# Patient Record
Sex: Male | Born: 1965 | Race: White | Hispanic: No | Marital: Married | Smoking: Former smoker
Health system: Southern US, Community
[De-identification: ages and names within clinical notes are randomized; demographics above are authoritative.]

## PROBLEM LIST (undated history)

## (undated) DIAGNOSIS — F32A Depression, unspecified: Secondary | ICD-10-CM

## (undated) DIAGNOSIS — E7800 Pure hypercholesterolemia, unspecified: Secondary | ICD-10-CM

## (undated) DIAGNOSIS — E039 Hypothyroidism, unspecified: Secondary | ICD-10-CM

## (undated) DIAGNOSIS — I10 Essential (primary) hypertension: Secondary | ICD-10-CM

## (undated) HISTORY — DX: Essential (primary) hypertension: I10

## (undated) HISTORY — DX: Hypothyroidism, unspecified: E03.9

## (undated) HISTORY — DX: Pure hypercholesterolemia, unspecified: E78.00

## (undated) HISTORY — DX: Depression, unspecified: F32.A

---
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
## 2010-07-31 HISTORY — PX: KNEE ARTHROSCOPY: SHX127

## 2012-07-31 HISTORY — PX: SHOULDER ARTHROSCOPY: SHX128

## 2014-07-31 HISTORY — PX: HERNIA REPAIR: SHX51

## 2015-08-10 LAB — HM COLONOSCOPY

## 2015-12-22 LAB — HM COLONOSCOPY

## 2019-10-30 LAB — CBC AND DIFFERENTIAL
HCT: 51 (ref 41–53)
Hemoglobin: 17.4 (ref 13.5–17.5)
Platelets: 267 (ref 150–399)
WBC: 8.3

## 2019-10-30 LAB — BASIC METABOLIC PANEL
BUN: 14 (ref 4–21)
CO2: 22 (ref 13–22)
Chloride: 104 (ref 99–108)
Creatinine: 0.9 (ref 0.6–1.3)
Glucose: 100
Potassium: 4.4 (ref 3.4–5.3)
Sodium: 141 (ref 137–147)

## 2019-10-30 LAB — LIPID PANEL
Cholesterol: 163 (ref 0–200)
HDL: 43 (ref 35–70)
LDL Cholesterol: 71
Triglycerides: 305 — AB (ref 40–160)

## 2019-10-30 LAB — COMPREHENSIVE METABOLIC PANEL
Albumin: 4.7 (ref 3.5–5.0)
Calcium: 9.7 (ref 8.7–10.7)
GFR calc non Af Amer: 98
Globulin: 2.2

## 2019-10-30 LAB — HEPATIC FUNCTION PANEL
ALT: 33 (ref 10–40)
AST: 38 (ref 14–40)
Alkaline Phosphatase: 72 (ref 25–125)

## 2019-10-30 LAB — HEMOGLOBIN A1C: Hemoglobin A1C: 5.3

## 2019-10-30 LAB — TESTOSTERONE: Testosterone: 610

## 2020-03-19 ENCOUNTER — Ambulatory Visit: Payer: 59 | Admitting: Podiatry

## 2020-03-19 ENCOUNTER — Encounter: Payer: Self-pay | Admitting: Podiatry

## 2020-03-19 ENCOUNTER — Ambulatory Visit (INDEPENDENT_AMBULATORY_CARE_PROVIDER_SITE_OTHER): Payer: 59

## 2020-03-19 ENCOUNTER — Other Ambulatory Visit: Payer: Self-pay

## 2020-03-19 DIAGNOSIS — M779 Enthesopathy, unspecified: Secondary | ICD-10-CM

## 2020-03-19 DIAGNOSIS — D3610 Benign neoplasm of peripheral nerves and autonomic nervous system, unspecified: Secondary | ICD-10-CM

## 2020-03-19 DIAGNOSIS — G5762 Lesion of plantar nerve, left lower limb: Secondary | ICD-10-CM | POA: Diagnosis not present

## 2020-03-19 NOTE — Patient Instructions (Signed)

## 2020-03-24 NOTE — Progress Notes (Signed)
Subjective:   Patient ID: Michael Miller, male   DOB: 54 y.o.   MRN: [id number]   HPI Patient presents stating that he has had a neuroma surgery left and has had reoccurrence and states it sore when he tries to be active and he gets shooting pain.  States it has been bothering him for around a year and he had surgery approximately 2-1/2 3 years ago   Review of Systems  All other systems reviewed and are negative.       Objective:  Physical Exam Vitals and nursing note reviewed.  Constitutional:      Appearance: He is well-developed.  Pulmonary:     Effort: Pulmonary effort is normal.  Musculoskeletal:        General: Normal range of motion.  Skin:    General: Skin is warm.  Neurological:     Mental Status: He is alert.     Neurovascular status intact muscle strength adequate range of motion within normal limits.  Patient is found to have what appears to be some neuroma-like symptomatology third interspace left with possibility also of discomfort in the joint.  Patient does take medicines also for gout but does not appear to have gout attack currently.  Patient has good digit perfusion well oriented x3     Assessment:  Possibility for neuroma symptomatology versus inflammatory capsulitis or other pathology     Plan:  H&P reviewed condition and at this point I did do a sterile prep and injected with a combination of Xylocaine Marcaine and dexamethasone Kenalog.  I want to see the results to this and we may have to make a decision about resection of the nerve  X-rays indicate that there is no signs of advanced arthritis or issues associated with gout

## 2020-04-09 ENCOUNTER — Ambulatory Visit: Payer: 59 | Admitting: Podiatry

## 2020-04-16 ENCOUNTER — Ambulatory Visit: Payer: 59 | Admitting: Podiatry

## 2020-05-13 ENCOUNTER — Encounter: Payer: Self-pay | Admitting: Podiatry

## 2020-05-13 ENCOUNTER — Other Ambulatory Visit: Payer: Self-pay

## 2020-05-13 ENCOUNTER — Ambulatory Visit: Payer: 59 | Admitting: Podiatry

## 2020-05-13 DIAGNOSIS — M7752 Other enthesopathy of left foot: Secondary | ICD-10-CM

## 2020-05-13 DIAGNOSIS — M779 Enthesopathy, unspecified: Secondary | ICD-10-CM

## 2020-05-13 DIAGNOSIS — M7751 Other enthesopathy of right foot: Secondary | ICD-10-CM

## 2020-05-13 DIAGNOSIS — D3610 Benign neoplasm of peripheral nerves and autonomic nervous system, unspecified: Secondary | ICD-10-CM

## 2020-05-14 NOTE — Progress Notes (Signed)
Subjective:   Patient ID: Michael Miller, male   DOB: 54 y.o.   MRN: [id number]   HPI Patient states he needs new orthotics and he stated he seemed to have a very short-term response to the medication we use last time and he is interested in what to do long-term.  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  I have recommended trying neurolysis treatment with consideration of resection if symptoms do not respond.  I did do a sterile prep I injected the nerve with a purified alcohol Marcaine solution and applied sterile dressing.  I then went ahead and casted him for orthotics to reduce forefoot stresses and pain and hopefully prevent surgery

## 2020-06-10 ENCOUNTER — Other Ambulatory Visit: Payer: 59 | Admitting: Orthotics

## 2020-06-10 ENCOUNTER — Ambulatory Visit: Payer: 59 | Admitting: Podiatry

## 2020-06-17 ENCOUNTER — Other Ambulatory Visit: Payer: Self-pay

## 2020-06-17 ENCOUNTER — Other Ambulatory Visit: Payer: 59 | Admitting: Orthotics

## 2020-06-17 ENCOUNTER — Ambulatory Visit: Payer: 59 | Admitting: Podiatry

## 2020-06-17 ENCOUNTER — Encounter: Payer: Self-pay | Admitting: Podiatry

## 2020-06-17 DIAGNOSIS — D3610 Benign neoplasm of peripheral nerves and autonomic nervous system, unspecified: Secondary | ICD-10-CM | POA: Diagnosis not present

## 2020-06-17 NOTE — Progress Notes (Signed)
Subjective:   Patient ID: Michael Miller, male   DOB: 54 y.o.   MRN: [id number]   HPI Patient presents stating that the area did not respond to the injection and he is most likely can have to have this fixed.  Patient points to the third interspace left foot he did have previous foot surgery about 3 and half to 4 years ago and its been giving him trouble for the last couple years   ROS      Objective:  Physical Exam  Neurovascular status intact with patient's third interspace left showing a positive Mulder sign pain to pressure and burning-like discomfort.  He had temporary response to medication of approximate 24 hours with reoccurrence of symptoms and it is painful with shoe gear     Assessment:  Appears to be a neuroma symptomatology third interspace left even though he had previous surgery done with possible residual regrowth or possibly that they were not successful in removing the offending tissue     Plan:  H&P reviewed condition at great length and discussed options.  He is going to start orthotics I do not recommend further injections as they have not been successful and I do think resection is necessary for the future.  Patient wants to get this done not sure on timing.  I did go ahead today and I allowed him to read consent form understanding alternative treatments complications and after extensive review signed.  Understands that this is revisional surgery absolutely no long-term guarantee this will solve the problem and is willing to accept risk and does want the procedure.  He will tentatively have surgery done in January he is encouraged to call with questions concerns and was given all preoperative instructions today

## 2020-08-05 ENCOUNTER — Telehealth: Payer: Self-pay

## 2020-08-05 NOTE — Telephone Encounter (Signed)
DOS 08/17/2020  NEURECTOMY 3RD LT - 28080  [employer] EFFECTIVE DATE - 07/31/2013  PLAN DEDUCTIBLE - $250.00 W/ $250.00 REMAINING OUT OF POCKET - $3300.00 W/ $3300.00 REMAINING COPAY $150.00 COINSURANCE - 85%  PER AUTOMATED SYSTEM, NO PRECERT REQUIRED FOR CPT 28080. CALL REF # [id number]

## 2020-08-17 ENCOUNTER — Encounter: Payer: Self-pay | Admitting: Podiatry

## 2020-08-17 ENCOUNTER — Other Ambulatory Visit: Payer: Self-pay | Admitting: Podiatry

## 2020-08-17 DIAGNOSIS — G5762 Lesion of plantar nerve, left lower limb: Secondary | ICD-10-CM | POA: Diagnosis not present

## 2020-08-20 ENCOUNTER — Encounter: Payer: Self-pay | Admitting: Podiatry

## 2020-08-23 ENCOUNTER — Encounter: Payer: Self-pay | Admitting: Podiatry

## 2020-08-23 ENCOUNTER — Other Ambulatory Visit: Payer: Self-pay

## 2020-08-23 ENCOUNTER — Ambulatory Visit (INDEPENDENT_AMBULATORY_CARE_PROVIDER_SITE_OTHER): Payer: 59 | Admitting: Podiatry

## 2020-08-23 ENCOUNTER — Encounter: Payer: 59 | Admitting: Podiatry

## 2020-08-23 DIAGNOSIS — D3610 Benign neoplasm of peripheral nerves and autonomic nervous system, unspecified: Secondary | ICD-10-CM | POA: Diagnosis not present

## 2020-08-24 NOTE — Progress Notes (Signed)
Subjective:   Patient ID: Michael Miller, male   DOB: 55 y.o.   MRN: [id number]   HPI Patient presents stating he is doing well after surgery and does not have the same pain he had had previous   ROS      Objective:  Physical Exam  Neurovascular status intact negative Bevelyn Buckles' sign noted wound edges well coapted third interspace left foot no drainage noted with numbness between the third and fourth toe     Assessment:  Doing well post neurectomy third interspace left     Plan:  Advised this patient on continued elevation compression and reapplied sterile dressing. Instructed on gradual increase in activities over the next few weeks and patient will be seen back to recheck

## 2020-08-25 ENCOUNTER — Encounter: Payer: Self-pay | Admitting: Podiatry

## 2020-08-26 ENCOUNTER — Other Ambulatory Visit: Payer: Self-pay

## 2020-08-26 ENCOUNTER — Encounter: Payer: Self-pay | Admitting: Internal Medicine

## 2020-08-26 ENCOUNTER — Ambulatory Visit: Payer: 59 | Admitting: Internal Medicine

## 2020-08-26 VITALS — BP 124/84 | HR 93 | Temp 98.9°F | Ht 70.0 in | Wt 196.0 lb

## 2020-08-26 DIAGNOSIS — M8949 Other hypertrophic osteoarthropathy, multiple sites: Secondary | ICD-10-CM | POA: Insufficient documentation

## 2020-08-26 DIAGNOSIS — E039 Hypothyroidism, unspecified: Secondary | ICD-10-CM

## 2020-08-26 DIAGNOSIS — M1A09X0 Idiopathic chronic gout, multiple sites, without tophus (tophi): Secondary | ICD-10-CM

## 2020-08-26 DIAGNOSIS — I10 Essential (primary) hypertension: Secondary | ICD-10-CM

## 2020-08-26 DIAGNOSIS — M159 Polyosteoarthritis, unspecified: Secondary | ICD-10-CM | POA: Insufficient documentation

## 2020-08-26 DIAGNOSIS — E785 Hyperlipidemia, unspecified: Secondary | ICD-10-CM

## 2020-08-26 MED ORDER — MELOXICAM 15 MG PO TABS: 15.0000 mg | ORAL_TABLET | Freq: Every day | ORAL | 1 refills | Status: DC

## 2020-08-26 NOTE — Progress Notes (Signed)
Subjective:  Patient ID: Michael Miller, male    DOB: 1966-04-16  Age: 55 y.o. MRN: [id number]  CC: Osteoarthritis and Hypertension  This visit occurred during the SARS-CoV-2 public health emergency.  Safety protocols were in place, including screening questions prior to the visit, additional usage of staff PPE, and extensive cleaning of exam room while observing appropriate contact time as indicated for disinfecting solutions.   NEW TO ME  HPI Michael Miller presents for establishing.  He recently moved from [place] to [facility] and tells me that he has had a complete physical within the last year.  He complains of pain in his small and large joints.  None of his joints never get red or swollen.  He describes it as a burning discomfort.  He tells me his blood pressure has been well controlled.  He is active and denies any recent episodes of chest pain, shortness of breath, palpitations, edema, or fatigue.  He tells me he will have his medical records sent to me.  History Michael Miller has a past medical history of Depression, Hypercholesteremia, Hypertension, and Hypothyroid.   He has a past surgical history that includes Tonsillectomy (1971).   His family history includes CAD in his brother and father; Hypertension in his brother.He reports that he has quit smoking. He has never used smokeless tobacco. He reports current alcohol use of about 7.0 standard drinks of alcohol per week. He reports that he does not use drugs.  Outpatient Medications Prior to Visit  Medication Sig Dispense Refill  . allopurinol (ZYLOPRIM) 300 MG tablet Take 300 mg by mouth daily.    Marland Kitchen amLODipine (NORVASC) 5 MG tablet Take 5 mg by mouth daily.    Marland Kitchen atorvastatin (LIPITOR) 20 MG tablet Take 20 mg by mouth daily.    . busPIRone (BUSPAR) 15 MG tablet Take 15 mg by mouth 2 (two) times daily.    . clonazePAM (KLONOPIN) 0.5 MG tablet Take 0.5 mg by mouth daily as needed.    Marland Kitchen erythromycin ophthalmic ointment SMARTSIG:In Eye(s)    .  sertraline (ZOLOFT) 50 MG tablet Take by mouth.    . XYOSTED 75 MG/0.5ML SOAJ Inject into the skin.     No facility-administered medications prior to visit.    ROS Review of Systems  Constitutional: Negative.  Negative for appetite change, diaphoresis, fatigue and unexpected weight change.  HENT: Negative.   Eyes: Negative for visual disturbance.  Respiratory: Negative for cough, chest tightness, shortness of breath and wheezing.   Cardiovascular: Negative for chest pain, palpitations and leg swelling.  Gastrointestinal: Negative for abdominal pain, blood in stool, constipation, diarrhea, nausea and vomiting.  Endocrine: Negative.   Genitourinary: Negative.  Negative for difficulty urinating, dysuria and hematuria.  Musculoskeletal: Positive for arthralgias. Negative for myalgias.  Skin: Negative.   Neurological: Negative.  Negative for dizziness, weakness and light-headedness.  Hematological: Negative for adenopathy. Does not bruise/bleed easily.  Psychiatric/Behavioral: Negative.      No results found for: WBC, HGB, HCT, PLT, GLUCOSE, CHOL, TRIG, HDL, LDLDIRECT, LDLCALC, ALT, AST, NA, K, CL, CREATININE, BUN, CO2, TSH, PSA, INR, GLUF, HGBA1C, MICROALBUR  Objective:  BP 124/84   Pulse 93   Temp 98.9 F (37.2 C) (Oral)   Ht 5\' 10"  (1.778 m)   Wt 196 lb (88.9 kg)   SpO2 95%   BMI 28.12 kg/m   Physical Exam Vitals reviewed.  HENT:     Nose: Nose normal.     Mouth/Throat:     Mouth: Mucous membranes  are moist.  Eyes:     General: No scleral icterus.    Conjunctiva/sclera: Conjunctivae normal.  Cardiovascular:     Rate and Rhythm: Normal rate and regular rhythm.     Heart sounds: No murmur heard.   Pulmonary:     Effort: Pulmonary effort is normal.     Breath sounds: No stridor. No wheezing, rhonchi or rales.  Abdominal:     General: Abdomen is flat.     Palpations: There is no mass.     Tenderness: There is no abdominal tenderness. There is no guarding.   Musculoskeletal:        General: Normal range of motion.     Cervical back: Neck supple.     Right lower leg: No edema.     Left lower leg: No edema.  Lymphadenopathy:     Cervical: No cervical adenopathy.  Skin:    General: Skin is warm and dry.     Coloration: Skin is not pale.  Neurological:     General: No focal deficit present.     Mental Status: He is alert.  Psychiatric:        Mood and Affect: Mood normal.       Assessment & Plan:   Michael Miller was seen today for osteoarthritis and hypertension.  Diagnoses and all orders for this visit:  Primary osteoarthritis involving multiple joints -     meloxicam (MOBIC) 15 MG tablet; Take 1 tablet (15 mg total) by mouth daily.  Idiopathic chronic gout of multiple sites without tophus  Acquired hypothyroidism  Hyperlipidemia with target LDL less than 130  Primary hypertension- His blood pressure is adequately well controlled.   I am having Michael Miller start on meloxicam. I am also having him maintain his allopurinol, amLODipine, atorvastatin, busPIRone, clonazePAM, erythromycin, sertraline, and Xyosted.  Meds ordered this encounter  Medications  . meloxicam (MOBIC) 15 MG tablet    Sig: Take 1 tablet (15 mg total) by mouth daily.    Dispense:  90 tablet    Refill:  1     Follow-up: No follow-ups on file.  Michael Calico, MD

## 2020-08-30 ENCOUNTER — Encounter: Payer: 59 | Admitting: Podiatry

## 2020-09-02 ENCOUNTER — Encounter: Payer: Self-pay | Admitting: Internal Medicine

## 2020-09-02 DIAGNOSIS — I10 Essential (primary) hypertension: Secondary | ICD-10-CM | POA: Insufficient documentation

## 2020-09-02 DIAGNOSIS — E039 Hypothyroidism, unspecified: Secondary | ICD-10-CM | POA: Insufficient documentation

## 2020-09-02 DIAGNOSIS — M1A09X0 Idiopathic chronic gout, multiple sites, without tophus (tophi): Secondary | ICD-10-CM | POA: Insufficient documentation

## 2020-09-02 DIAGNOSIS — E785 Hyperlipidemia, unspecified: Secondary | ICD-10-CM | POA: Insufficient documentation

## 2020-09-02 NOTE — Patient Instructions (Signed)
Osteoarthritis  Osteoarthritis is a type of arthritis. It refers to joint pain or joint disease. Osteoarthritis affects tissue that covers the ends of bones in joints (cartilage). Cartilage acts as a cushion between the bones and helps them move smoothly. Osteoarthritis occurs when cartilage in the joints gets worn down. Osteoarthritis is sometimes called "wear and tear" arthritis. Osteoarthritis is the most common form of arthritis. It often occurs in older people. It is a condition that gets worse over time. The joints most often affected by this condition are in the fingers, toes, hips, knees, and spine, including the neck and lower back. What are the causes? This condition is caused by the wearing down of cartilage that covers the ends of bones. What increases the risk? The following factors may make you more likely to develop this condition:  Being age 50 or older.  Obesity.  Overuse of joints.  Past injury of a joint.  Past surgery on a joint.  Family history of osteoarthritis. What are the signs or symptoms? The main symptoms of this condition are pain, swelling, and stiffness in the joint. Other symptoms may include:  An enlarged joint.  More pain and further damage caused by small pieces of bone or cartilage that break off and float inside of the joint.  Small deposits of bone (osteophytes) that grow on the edges of the joint.  A grating or scraping feeling inside the joint when you move it.  Popping or creaking sounds when you move.  Difficulty walking or exercising.  An inability to grip items, twist your hand(s), or control the movements of your hands and fingers. How is this diagnosed? This condition may be diagnosed based on:  Your medical history.  A physical exam.  Your symptoms.  X-rays of the affected joint(s).  Blood tests to rule out other types of arthritis. How is this treated? There is no cure for this condition, but treatment can help control  pain and improve joint function. Treatment may include a combination of therapies, such as:  Pain relief techniques, such as: ? Applying heat and cold to the joint. ? Massage. ? A form of talk therapy called cognitive behavioral therapy (CBT). This therapy helps you set goals and follow up on the changes that you make.  Medicines for pain and inflammation. The medicines can be taken by mouth or applied to the skin. They include: ? NSAIDs, such as ibuprofen. ? Prescription medicines. ? Strong anti-inflammatory medicines (corticosteroids). ? Certain nutritional supplements.  A prescribed exercise program. You may work with a physical therapist.  Assistive devices, such as a brace, wrap, splint, specialized glove, or cane.  A weight control plan.  Surgery, such as: ? An osteotomy. This is done to reposition the bones and relieve pain or to remove loose pieces of bone and cartilage. ? Joint replacement surgery. You may need this surgery if you have advanced osteoarthritis. Follow these instructions at home: Activity  Rest your affected joints as told by your health care provider.  Exercise as told by your health care provider. He or she may recommend specific types of exercise, such as: ? Strengthening exercises. These are done to strengthen the muscles that support joints affected by arthritis. ? Aerobic activities. These are exercises, such as brisk walking or water aerobics, that increase your heart rate. ? Range-of-motion activities. These help your joints move more easily. ? Balance and agility exercises. Managing pain, stiffness, and swelling  If directed, apply heat to the affected area as often   as told by your health care provider. Use the heat source that your health care provider recommends, such as a moist heat pack or a heating pad. ? If you have a removable assistive device, remove it as told by your health care provider. ? Place a towel between your skin and the heat  source. If your health care provider tells you to keep the assistive device on while you apply heat, place a towel between the assistive device and the heat source. ? Leave the heat on for 20-30 minutes. ? Remove the heat if your skin turns bright red. This is especially important if you are unable to feel pain, heat, or cold. You may have a greater risk of getting burned.  If directed, put ice on the affected area. To do this: ? If you have a removable assistive device, remove it as told by your health care provider. ? Put ice in a plastic bag. ? Place a towel between your skin and the bag. If your health care provider tells you to keep the assistive device on during icing, place a towel between the assistive device and the bag. ? Leave the ice on for 20 minutes, 2-3 times a day. ? Move your fingers or toes often to reduce stiffness and swelling. ? Raise (elevate) the injured area above the level of your heart while you are sitting or lying down.      General instructions  Take over-the-counter and prescription medicines only as told by your health care provider.  Maintain a healthy weight. Follow instructions from your health care provider for weight control.  Do not use any products that contain nicotine or tobacco, such as cigarettes, e-cigarettes, and chewing tobacco. If you need help quitting, ask your health care provider.  Use assistive devices as told by your health care provider.  Keep all follow-up visits as told by your health care provider. This is important. Where to find more information  National Institute of Arthritis and Musculoskeletal and Skin Diseases: www.niams.nih.gov  National Institute on Aging: www.nia.nih.gov  American College of Rheumatology: www.rheumatology.org Contact a health care provider if:  You have redness, swelling, or a feeling of warmth in a joint that gets worse.  You have a fever along with joint or muscle aches.  You develop a  rash.  You have trouble doing your normal activities. Get help right away if:  You have pain that gets worse and is not relieved by pain medicine. Summary  Osteoarthritis is a type of arthritis that affects tissue covering the ends of bones in joints (cartilage).  This condition is caused by the wearing down of cartilage that covers the ends of bones.  The main symptom of this condition is pain, swelling, and stiffness in the joint.  There is no cure for this condition, but treatment can help control pain and improve joint function. This information is not intended to replace advice given to you by your health care provider. Make sure you discuss any questions you have with your health care provider. Document Revised: 07/14/2019 Document Reviewed: 07/14/2019 Elsevier Patient Education  2021 [employer]  

## 2020-09-07 ENCOUNTER — Encounter: Payer: Self-pay | Admitting: Internal Medicine

## 2020-09-13 ENCOUNTER — Encounter: Payer: Self-pay | Admitting: Internal Medicine

## 2020-09-13 ENCOUNTER — Ambulatory Visit: Payer: 59 | Admitting: Internal Medicine

## 2020-09-13 ENCOUNTER — Other Ambulatory Visit: Payer: Self-pay

## 2020-09-13 DIAGNOSIS — R42 Dizziness and giddiness: Secondary | ICD-10-CM | POA: Insufficient documentation

## 2020-09-13 NOTE — Patient Instructions (Signed)
We have cleaned out the right ear today which should help.   Benign Positional Vertigo Vertigo is the feeling that you or your surroundings are moving when they are not. Benign positional vertigo is the most common form of vertigo. This is usually a harmless condition (benign). This condition is positional. This means that symptoms are triggered by certain movements and positions. This condition can be dangerous if it occurs while you are doing something that could cause harm to you or others. This includes activities such as driving or operating machinery. What are the causes? The inner ear has fluid-filled canals that help your brain sense movement and balance. When the fluid moves, the brain receives messages about your body's position. With benign positional vertigo, crystals in the inner ear break free and disturb the inner ear area. This causes your brain to receive confusing messages about your body's position. What increases the risk? You are more likely to develop this condition if:  You are a woman.  You are 21 years of age or older.  You have recently had a head injury.  You have an inner ear disease. What are the signs or symptoms? Symptoms of this condition usually happen when you move your head or your eyes in different directions. Symptoms may start suddenly, and usually last for less than a minute. They include:  Loss of balance and falling.  Feeling like you are spinning or moving.  Feeling like your surroundings are spinning or moving.  Nausea and vomiting.  Blurred vision.  Dizziness.  Involuntary eye movement (nystagmus). Symptoms can be mild and cause only minor problems, or they can be severe and interfere with daily life. Episodes of benign positional vertigo may return (recur) over time. Symptoms may improve over time. How is this diagnosed? This condition may be diagnosed based on:  Your medical history.  Physical exam of the head, neck, and  ears.  Positional tests to check for or stimulate vertigo. You may be asked to turn your head and change positions, such as going from sitting to lying down. A health care provider will watch for symptoms of vertigo. You may be referred to a health care provider who specializes in ear, nose, and throat problems (ENT, or otolaryngologist) or a provider who specializes in disorders of the nervous system (neurologist). How is this treated? This condition may be treated in a session in which your health care provider moves your head in specific positions to help the displaced crystals in your inner ear move. Treatment for this condition may take several sessions. Surgery may be needed in severe cases, but this is rare. In some cases, benign positional vertigo may resolve on its own in 2-4 weeks.   Follow these instructions at home: Safety  Move slowly. Avoid sudden body or head movements or certain positions, as told by your health care provider.  Avoid driving until your health care provider says it is safe for you to do so.  Avoid operating heavy machinery until your health care provider says it is safe for you to do so.  Avoid doing any tasks that would be dangerous to you or others if vertigo occurs.  If you have trouble walking or keeping your balance, try using a cane for stability. If you feel dizzy or unstable, sit down right away.  Return to your normal activities as told by your health care provider. Ask your health care provider what activities are safe for you. General instructions  Take over-the-counter and prescription  medicines only as told by your health care provider.  Drink enough fluid to keep your urine pale yellow.  Keep all follow-up visits as told by your health care provider. This is important. Contact a health care provider if:  You have a fever.  Your condition gets worse or you develop new symptoms.  Your family or friends notice any behavioral changes.  You  have nausea or vomiting that gets worse.  You have numbness or a prickling and tingling sensation. Get help right away if you:  Have difficulty speaking or moving.  Are always dizzy.  Faint.  Develop severe headaches.  Have weakness in your legs or arms.  Have changes in your hearing or vision.  Develop a stiff neck.  Develop sensitivity to light. Summary  Vertigo is the feeling that you or your surroundings are moving when they are not. Benign positional vertigo is the most common form of vertigo.  This condition is caused by crystals in the inner ear that become displaced. This causes a disturbance in an area of the inner ear that helps your brain sense movement and balance.  Symptoms include loss of balance and falling, feeling that you or your surroundings are moving, nausea and vomiting, and blurred vision.  This condition can be diagnosed based on symptoms, a physical exam, and positional tests.  Follow safety instructions as told by your health care provider. You will also be told when to contact your health care provider in case of problems. This information is not intended to replace advice given to you by your health care provider. Make sure you discuss any questions you have with your health care provider. Document Revised: 06/10/2019 Document Reviewed: 12/26/2017 Elsevier Patient Education  2021 [employer].

## 2020-09-13 NOTE — Progress Notes (Signed)
   Subjective:   Patient ID: Michael Miller, male    DOB: May 13, 1966, 55 y.o.   MRN: [id number]  HPI The patient is a 55 YO man coming in for episode of dizziness. Started Friday with waking up and turning head. Got some dizziness and lightheadedness. Faded and he was able to stand up. Mild nausea during this episode, no vomiting. The dizziness persisted throughout the day exacerbated by head turn and bending down. Still was present but not a severe Saturday and by Sunday almost normal. Denies preceeding illness. Most recent medication from about 2-3 weeks ago meloxicam which he has taken before without problems. Denies ear pain or hearing changes.   Review of Systems  Constitutional: Negative.   HENT: Negative.   Eyes: Negative.   Respiratory: Negative for cough, chest tightness and shortness of breath.   Cardiovascular: Negative for chest pain, palpitations and leg swelling.  Gastrointestinal: Negative for abdominal distention, abdominal pain, constipation, diarrhea, nausea and vomiting.  Musculoskeletal: Negative.   Skin: Negative.   Neurological: Positive for dizziness and light-headedness.  Psychiatric/Behavioral: Negative.     Objective:  Physical Exam Constitutional:      Appearance: He is well-developed and well-nourished.  HENT:     Head: Normocephalic and atraumatic.     Comments:  right ear canal impacted with copious hard wax, examination post ear lavage canal is clear and no bleeding or complications noted.    Right Ear: Tympanic membrane normal. There is impacted cerumen.     Left Ear: Tympanic membrane normal.  Eyes:     Extraocular Movements: EOM normal.  Cardiovascular:     Rate and Rhythm: Normal rate and regular rhythm.  Pulmonary:     Effort: Pulmonary effort is normal. No respiratory distress.     Breath sounds: Normal breath sounds. No wheezing or rales.  Abdominal:     General: Bowel sounds are normal. There is no distension.     Palpations: Abdomen is soft.      Tenderness: There is no abdominal tenderness. There is no rebound.  Musculoskeletal:        General: No edema.     Cervical back: Normal range of motion.  Skin:    General: Skin is warm and dry.  Neurological:     Mental Status: He is alert and oriented to person, place, and time.     Coordination: Coordination normal.  Psychiatric:        Mood and Affect: Mood and affect normal.     Vitals:   09/13/20 0845  BP: 128/82  Pulse: 66  Resp: 18  Temp: (!) 97.5 F (36.4 C)  TempSrc: Oral  SpO2: 96%  Weight: 197 lb 9.6 oz (89.6 kg)  Height: 5\' 10"  (1.778 m)    This visit occurred during the SARS-CoV-2 public health emergency.  Safety protocols were in place, including screening questions prior to the visit, additional usage of staff PPE, and extensive cleaning of exam room while observing appropriate contact time as indicated for disinfecting solutions.   Assessment & Plan:

## 2020-09-13 NOTE — Assessment & Plan Note (Signed)
Could have been related to wax obstruction which is removed today during visit. No signs of infection. Talked to him about epley maneuver if recurrent to help and etiology of BPPV.

## 2020-09-15 ENCOUNTER — Encounter: Payer: Self-pay | Admitting: Internal Medicine

## 2020-09-20 ENCOUNTER — Other Ambulatory Visit: Payer: Self-pay | Admitting: Internal Medicine

## 2020-09-20 DIAGNOSIS — K635 Polyp of colon: Secondary | ICD-10-CM

## 2020-10-18 ENCOUNTER — Other Ambulatory Visit: Payer: Self-pay | Admitting: Internal Medicine

## 2020-10-25 ENCOUNTER — Encounter: Payer: Self-pay | Admitting: Internal Medicine

## 2020-10-26 ENCOUNTER — Encounter: Payer: Self-pay | Admitting: Internal Medicine

## 2020-10-26 ENCOUNTER — Telehealth (INDEPENDENT_AMBULATORY_CARE_PROVIDER_SITE_OTHER): Payer: 59 | Admitting: Internal Medicine

## 2020-10-26 DIAGNOSIS — J3089 Other allergic rhinitis: Secondary | ICD-10-CM

## 2020-10-26 DIAGNOSIS — J309 Allergic rhinitis, unspecified: Secondary | ICD-10-CM | POA: Insufficient documentation

## 2020-10-26 MED ORDER — MONTELUKAST SODIUM 10 MG PO TABS: 10.0000 mg | ORAL_TABLET | Freq: Every day | ORAL | 0 refills | Status: DC

## 2020-10-26 NOTE — Progress Notes (Signed)
Virtual Visit via Video Note  I connected with Michael Miller on 10/26/20 at  3:00 PM EDT by a video enabled telemedicine application and verified that I am speaking with the correct person using two identifiers.  The patient and the provider were at separate locations throughout the entire encounter. Patient location: home, Provider location: work   I discussed the limitations of evaluation and management by telemedicine and the availability of in person appointments. The patient expressed understanding and agreed to proceed. The patient and the provider were the only parties present for the visit unless noted in HPI below.  History of Present Illness: The patient is a 55 y.o. man with visit for cough and congestion. Started 4-5 days ago. Has some fatigue and eye burning and itching. Denies fevers or chills. Overall it is stable but not improving. Has tried allegra which has not helped. Taking dayquil and nyquil which did not help much either. Has been taking flonase as well. Has had 3 shots of covid-19 immunization. Did take home covid-19 test which was negative.   Observations/Objective: Appearance: normal, breathing appears normal no coughing during visit, casual grooming, abdomen does not appear distended, throat not well visualized, mental status is A and O times 3  Assessment and Plan: See problem oriented charting  Follow Up Instructions: rx singulair  I discussed the assessment and treatment plan with the patient. The patient was provided an opportunity to ask questions and all were answered. The patient agreed with the plan and demonstrated an understanding of the instructions.   The patient was advised to call back or seek an in-person evaluation if the symptoms worsen or if the condition fails to improve as anticipated.  Hoyt Koch, MD

## 2020-10-26 NOTE — Assessment & Plan Note (Signed)
Rx singulair. Continue allegra and flonase scheduled and dayquil/nyquil as needed.

## 2020-11-17 ENCOUNTER — Other Ambulatory Visit: Payer: Self-pay | Admitting: Internal Medicine

## 2020-11-25 ENCOUNTER — Other Ambulatory Visit: Payer: Self-pay | Admitting: Internal Medicine

## 2020-11-26 ENCOUNTER — Other Ambulatory Visit: Payer: Self-pay | Admitting: Internal Medicine

## 2020-12-08 ENCOUNTER — Encounter: Payer: Self-pay | Admitting: Internal Medicine

## 2020-12-09 ENCOUNTER — Other Ambulatory Visit: Payer: Self-pay | Admitting: Internal Medicine

## 2020-12-09 DIAGNOSIS — E291 Testicular hypofunction: Secondary | ICD-10-CM

## 2020-12-09 MED ORDER — XYOSTED 75 MG/0.5ML ~~LOC~~ SOAJ
75.0000 mg | SUBCUTANEOUS | 0 refills | Status: DC
Start: 2020-12-09 — End: 2021-03-29

## 2020-12-14 ENCOUNTER — Other Ambulatory Visit: Payer: Self-pay | Admitting: Internal Medicine

## 2020-12-22 ENCOUNTER — Ambulatory Visit: Payer: 59 | Admitting: Internal Medicine

## 2020-12-22 ENCOUNTER — Other Ambulatory Visit: Payer: Self-pay

## 2020-12-22 ENCOUNTER — Encounter: Payer: Self-pay | Admitting: Internal Medicine

## 2020-12-22 VITALS — BP 138/88 | HR 76 | Temp 98.2°F | Resp 16 | Ht 70.0 in | Wt 189.0 lb

## 2020-12-22 DIAGNOSIS — I4510 Unspecified right bundle-branch block: Secondary | ICD-10-CM | POA: Insufficient documentation

## 2020-12-22 DIAGNOSIS — I10 Essential (primary) hypertension: Secondary | ICD-10-CM | POA: Diagnosis not present

## 2020-12-22 DIAGNOSIS — H6122 Impacted cerumen, left ear: Secondary | ICD-10-CM | POA: Insufficient documentation

## 2020-12-22 DIAGNOSIS — Z0001 Encounter for general adult medical examination with abnormal findings: Secondary | ICD-10-CM | POA: Insufficient documentation

## 2020-12-22 NOTE — Patient Instructions (Signed)

## 2020-12-22 NOTE — Progress Notes (Signed)
Patient consent obtained. Irrigation with water and peroxide performed. Full view of tympanic membranes after procedure.  Patient tolerated procedure well.   

## 2020-12-22 NOTE — Progress Notes (Signed)
Subjective:  Patient ID: Michael Miller, male    DOB: [birth year]  Age: 55 y.o. MRN: [id number]  CC: Hypertension  This visit occurred during the SARS-CoV-2 public health emergency.  Safety protocols were in place, including screening questions prior to the visit, additional usage of staff PPE, and extensive cleaning of exam room while observing appropriate contact time as indicated for disinfecting solutions.    HPI Eliav Mechling presents for f/up -   He was diagnosed with right bundle branch block about 8 years ago.  He tells me he saw a cardiologist in [place] and told that his cardiac work-up was unremarkable.  His blood pressure has been well controlled.  He is active and denies any recent episodes of palpitations, dizziness, lightheadedness, chest pain, shortness of breath.  He complains of a several week history of muffled sensation and loss of hearing in his left ear.  Outpatient Medications Prior to Visit  Medication Sig Dispense Refill  . allopurinol (ZYLOPRIM) 300 MG tablet Take 300 mg by mouth daily.    Marland Kitchen amLODipine (NORVASC) 5 MG tablet Take 5 mg by mouth daily.    Marland Kitchen atorvastatin (LIPITOR) 20 MG tablet Take 20 mg by mouth daily.    . busPIRone (BUSPAR) 15 MG tablet Take 15 mg by mouth daily.    . clonazePAM (KLONOPIN) 0.5 MG tablet TAKE 1 TABLET BY MOUTH EVERY DAY AS NEEDED FOR ANXIETY 30 tablet 2  . meloxicam (MOBIC) 15 MG tablet Take 1 tablet (15 mg total) by mouth daily. 90 tablet 1  . montelukast (SINGULAIR) 10 MG tablet TAKE 1 TABLET BY MOUTH EVERYDAY AT BEDTIME 90 tablet 0  . sertraline (ZOLOFT) 50 MG tablet Take 50 mg by mouth daily.    . XYOSTED 75 MG/0.5ML SOAJ Inject 75 mg into the skin once a week. 6 mL 0   No facility-administered medications prior to visit.    ROS Review of Systems  Constitutional: Negative for activity change, diaphoresis and fatigue.  HENT: Positive for hearing loss. Negative for ear discharge, ear pain, facial swelling, rhinorrhea, sinus  pressure and trouble swallowing.   Eyes: Negative.   Respiratory: Negative for cough, chest tightness, shortness of breath and wheezing.   Cardiovascular: Negative for chest pain, palpitations and leg swelling.  Gastrointestinal: Negative for abdominal pain, diarrhea, nausea and vomiting.  Endocrine: Negative.   Genitourinary: Negative.  Negative for difficulty urinating, flank pain and hematuria.  Musculoskeletal: Negative.   Skin: Negative.  Negative for color change.  Neurological: Negative.  Negative for dizziness, weakness, light-headedness and headaches.  Hematological: Negative for adenopathy. Does not bruise/bleed easily.  Psychiatric/Behavioral: Negative.     Objective:  BP 138/88 (BP Location: Left Arm, Patient Position: Sitting, Cuff Size: Large)   Pulse 76   Temp 98.2 F (36.8 C) (Oral)   Resp 16   Ht 5\' 10"  (1.778 m)   Wt 189 lb (85.7 kg)   SpO2 97%   BMI 27.12 kg/m   BP Readings from Last 3 Encounters:  12/22/20 138/88  09/13/20 128/82  08/26/20 124/84    Wt Readings from Last 3 Encounters:  12/22/20 189 lb (85.7 kg)  09/13/20 197 lb 9.6 oz (89.6 kg)  08/26/20 196 lb (88.9 kg)    Physical Exam Vitals reviewed.  Constitutional:      Appearance: Normal appearance.  HENT:     Right Ear: Hearing, tympanic membrane, ear canal and external ear normal.     Left Ear: There is impacted cerumen. No mastoid tenderness.  Tympanic membrane is not injected or erythematous.     Nose: Nose normal.     Mouth/Throat:     Mouth: Mucous membranes are moist.  Eyes:     Conjunctiva/sclera: Conjunctivae normal.  Cardiovascular:     Rate and Rhythm: Normal rate and regular rhythm.     Heart sounds: Normal heart sounds, S1 normal and S2 normal. No murmur heard. No gallop.      Comments: EKG- Sinus rhythm, 69 bpm RBBB, LAFB Minimal LVH No old EKG's for comparison Pulmonary:     Effort: Pulmonary effort is normal.     Breath sounds: No stridor. No wheezing, rhonchi or  rales.  Abdominal:     General: Abdomen is flat.     Palpations: There is no mass.     Tenderness: There is no abdominal tenderness. There is no guarding.  Musculoskeletal:     Cervical back: Neck supple.     Right lower leg: No edema.     Left lower leg: No edema.  Skin:    General: Skin is warm.  Neurological:     General: No focal deficit present.     Mental Status: He is alert.  Psychiatric:        Mood and Affect: Mood normal.        Behavior: Behavior normal.     Lab Results  Component Value Date   WBC 8.3 10/30/2019   HGB 17.4 10/30/2019   HCT 51 10/30/2019   PLT 267 10/30/2019   CHOL 163 10/30/2019   TRIG 305 (A) 10/30/2019   HDL 43 10/30/2019   LDLCALC 71 10/30/2019   ALT 33 10/30/2019   AST 38 10/30/2019   NA 141 10/30/2019   K 4.4 10/30/2019   CL 104 10/30/2019   CREATININE 0.9 10/30/2019   BUN 14 10/30/2019   CO2 22 10/30/2019   HGBA1C 5.3 10/30/2019    I put Colace in his left ear and then irrigated it using water and an ear pick.  The cerumen was successfully removed.  His hearing has returned to normal.  The examination afterwards is normal.  He tolerated this well.  Assessment & Plan:   Antrell was seen today for hypertension.  Diagnoses and all orders for this visit:  Primary hypertension- His blood pressure is adequately well controlled. 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  He is asymptomatic and tells me he has had this evaluated in the past. -     EKG 12-Lead   Hearing loss of left ear due to cerumen impaction- Improvement noted after the cerumen was removed.  Other orders -     Cancel: Hepatitis C antibody; Future -     Cancel: HIV Antibody (routine testing w rflx); Future   I am having [name] maintain his allopurinol, amLODipine, atorvastatin, busPIRone, sertraline, meloxicam, clonazePAM, Xyosted, and montelukast.  No orders of the defined types were placed in this  encounter.    Follow-up: Return in about 3 months (around 03/24/2021).  Scarlette Calico, MD

## 2021-03-14 ENCOUNTER — Other Ambulatory Visit: Payer: Self-pay | Admitting: Internal Medicine

## 2021-03-15 ENCOUNTER — Encounter: Payer: Self-pay | Admitting: Internal Medicine

## 2021-03-17 ENCOUNTER — Other Ambulatory Visit: Payer: Self-pay | Admitting: Internal Medicine

## 2021-03-23 ENCOUNTER — Encounter: Payer: Self-pay | Admitting: Internal Medicine

## 2021-03-23 ENCOUNTER — Other Ambulatory Visit: Payer: Self-pay

## 2021-03-23 ENCOUNTER — Ambulatory Visit (INDEPENDENT_AMBULATORY_CARE_PROVIDER_SITE_OTHER): Payer: 59 | Admitting: Internal Medicine

## 2021-03-23 VITALS — BP 128/84 | HR 64 | Temp 97.7°F | Resp 16 | Ht 70.0 in | Wt 191.4 lb

## 2021-03-23 DIAGNOSIS — M1A09X0 Idiopathic chronic gout, multiple sites, without tophus (tophi): Secondary | ICD-10-CM

## 2021-03-23 DIAGNOSIS — Z0001 Encounter for general adult medical examination with abnormal findings: Secondary | ICD-10-CM | POA: Diagnosis not present

## 2021-03-23 DIAGNOSIS — R300 Dysuria: Secondary | ICD-10-CM | POA: Diagnosis not present

## 2021-03-23 DIAGNOSIS — E039 Hypothyroidism, unspecified: Secondary | ICD-10-CM

## 2021-03-23 DIAGNOSIS — E785 Hyperlipidemia, unspecified: Secondary | ICD-10-CM

## 2021-03-23 DIAGNOSIS — I10 Essential (primary) hypertension: Secondary | ICD-10-CM | POA: Diagnosis not present

## 2021-03-23 DIAGNOSIS — Z1159 Encounter for screening for other viral diseases: Secondary | ICD-10-CM | POA: Insufficient documentation

## 2021-03-23 DIAGNOSIS — N411 Chronic prostatitis: Secondary | ICD-10-CM | POA: Insufficient documentation

## 2021-03-23 LAB — LIPID PANEL
Cholesterol: 173 mg/dL (ref 0–200)
HDL: 48.6 mg/dL (ref >39.00)
LDL Cholesterol: 87 mg/dL (ref 0–99)
NonHDL: 124.18
Total CHOL/HDL Ratio: 4
Triglycerides: 185 mg/dL — ABNORMAL HIGH (ref 0.0–149.0)
VLDL: 37 mg/dL (ref 0.0–40.0)

## 2021-03-23 LAB — BASIC METABOLIC PANEL
BUN: 15 mg/dL (ref 6–23)
CO2: 25 mEq/L (ref 19–32)
Calcium: 10 mg/dL (ref 8.4–10.5)
Chloride: 100 mEq/L (ref 96–112)
Creatinine, Ser: 0.94 mg/dL (ref 0.40–1.50)
GFR: 90.77 mL/min (ref >60.00)
Glucose, Bld: 92 mg/dL (ref 70–99)
Potassium: 4.2 mEq/L (ref 3.5–5.1)
Sodium: 136 mEq/L (ref 135–145)

## 2021-03-23 LAB — URINALYSIS, ROUTINE W REFLEX MICROSCOPIC
Bilirubin Urine: NEGATIVE
Ketones, ur: NEGATIVE
Leukocytes,Ua: NEGATIVE
Nitrite: NEGATIVE
Specific Gravity, Urine: 1.01 (ref 1.000–1.030)
Total Protein, Urine: NEGATIVE
Urine Glucose: NEGATIVE
Urobilinogen, UA: 0.2 (ref 0.0–1.0)
WBC, UA: NONE SEEN (ref 0–?)
pH: 6 (ref 5.0–8.0)

## 2021-03-23 LAB — HEPATIC FUNCTION PANEL
ALT: 35 U/L (ref 0–53)
AST: 32 U/L (ref 0–37)
Albumin: 4.7 g/dL (ref 3.5–5.2)
Alkaline Phosphatase: 57 U/L (ref 39–117)
Bilirubin, Direct: 0.2 mg/dL (ref 0.0–0.3)
Total Bilirubin: 1.1 mg/dL (ref 0.2–1.2)
Total Protein: 7.3 g/dL (ref 6.0–8.3)

## 2021-03-23 LAB — PSA: PSA: 0.5 ng/mL (ref 0.10–4.00)

## 2021-03-23 LAB — CBC WITH DIFFERENTIAL/PLATELET
Basophils Absolute: 0.1 10*3/uL (ref 0.0–0.1)
Basophils Relative: 0.7 % (ref 0.0–3.0)
Eosinophils Absolute: 0.1 10*3/uL (ref 0.0–0.7)
Eosinophils Relative: 1.5 % (ref 0.0–5.0)
HCT: 48.9 % (ref 39.0–52.0)
Hemoglobin: 16.9 g/dL (ref 13.0–17.0)
Lymphocytes Relative: 19.4 % (ref 12.0–46.0)
Lymphs Abs: 1.5 10*3/uL (ref 0.7–4.0)
MCHC: 34.6 g/dL (ref 30.0–36.0)
MCV: 90.4 fl (ref 78.0–100.0)
Monocytes Absolute: 0.7 10*3/uL (ref 0.1–1.0)
Monocytes Relative: 9.1 % (ref 3.0–12.0)
Neutro Abs: 5.5 10*3/uL (ref 1.4–7.7)
Neutrophils Relative %: 69.3 % (ref 43.0–77.0)
Platelets: 239 10*3/uL (ref 150.0–400.0)
RBC: 5.4 Mil/uL (ref 4.22–5.81)
RDW: 13.4 % (ref 11.5–15.5)
WBC: 7.9 10*3/uL (ref 4.0–10.5)

## 2021-03-23 LAB — URIC ACID: Uric Acid, Serum: 6.1 mg/dL (ref 4.0–7.8)

## 2021-03-23 LAB — TSH: TSH: 2.07 u[IU]/mL (ref 0.35–5.50)

## 2021-03-23 MED ORDER — SULFAMETHOXAZOLE-TRIMETHOPRIM 800-160 MG PO TABS: 1.0000 | ORAL_TABLET | Freq: Two times a day (BID) | ORAL | 0 refills | Status: AC

## 2021-03-23 NOTE — Patient Instructions (Signed)
Health Maintenance, Male Adopting a healthy lifestyle and getting preventive care are important in promoting health and wellness. Ask your health care provider about: The right schedule for you to have regular tests and exams. Things you can do on your own to prevent diseases and keep yourself healthy. What should I know about diet, weight, and exercise? Eat a healthy diet  Eat a diet that includes plenty of vegetables, fruits, low-fat dairy products, and lean protein. Do not eat a lot of foods that are high in solid fats, added sugars, or sodium.  Maintain a healthy weight Body mass index (BMI) is a measurement that can be used to identify possible weight problems. It estimates body fat based on height and weight. Your health care provider can help determine your BMI and help you achieve or maintain ahealthy weight. Get regular exercise Get regular exercise. This is one of the most important things you can do for your health. Most adults should: Exercise for at least 150 minutes each week. The exercise should increase your heart rate and make you sweat (moderate-intensity exercise). Do strengthening exercises at least twice a week. This is in addition to the moderate-intensity exercise. Spend less time sitting. Even light physical activity can be beneficial. Watch cholesterol and blood lipids Have your blood tested for lipids and cholesterol at 56 years of age, then havethis test every 5 years. You may need to have your cholesterol levels checked more often if: Your lipid or cholesterol levels are high. You are older than 56 years of age. You are at high risk for heart disease. What should I know about cancer screening? Many types of cancers can be detected early and may often be prevented. Depending on your health history and family history, you may need to have cancer screening at various ages. This may include screening for: Colorectal cancer. Prostate cancer. Skin cancer. Lung  cancer. What should I know about heart disease, diabetes, and high blood pressure? Blood pressure and heart disease High blood pressure causes heart disease and increases the risk of stroke. This is more likely to develop in people who have high blood pressure readings, are of African descent, or are overweight. Talk with your health care provider about your target blood pressure readings. Have your blood pressure checked: Every 3-5 years if you are 18-39 years of age. Every year if you are 40 years old or older. If you are between the ages of 65 and 75 and are a current or former smoker, ask your health care provider if you should have a one-time screening for abdominal aortic aneurysm (AAA). Diabetes Have regular diabetes screenings. This checks your fasting blood sugar level. Have the screening done: Once every three years after age 45 if you are at a normal weight and have a low risk for diabetes. More often and at a younger age if you are overweight or have a high risk for diabetes. What should I know about preventing infection? Hepatitis B If you have a higher risk for hepatitis B, you should be screened for this virus. Talk with your health care provider to find out if you are at risk forhepatitis B infection. Hepatitis C Blood testing is recommended for: Everyone born from 1945 through 1965. Anyone with known risk factors for hepatitis C. Sexually transmitted infections (STIs) You should be screened each year for STIs, including gonorrhea and chlamydia, if: You are sexually active and are younger than 56 years of age. You are older than 56 years of age   and your health care provider tells you that you are at risk for this type of infection. Your sexual activity has changed since you were last screened, and you are at increased risk for chlamydia or gonorrhea. Ask your health care provider if you are at risk. Ask your health care provider about whether you are at high risk for HIV.  Your health care provider may recommend a prescription medicine to help prevent HIV infection. If you choose to take medicine to prevent HIV, you should first get tested for HIV. You should then be tested every 3 months for as long as you are taking the medicine. Follow these instructions at home: Lifestyle Do not use any products that contain nicotine or tobacco, such as cigarettes, e-cigarettes, and chewing tobacco. If you need help quitting, ask your health care provider. Do not use street drugs. Do not share needles. Ask your health care provider for help if you need support or information about quitting drugs. Alcohol use Do not drink alcohol if your health care provider tells you not to drink. If you drink alcohol: Limit how much you have to 0-2 drinks a day. Be aware of how much alcohol is in your drink. In the [place] one drink equals one 12 oz bottle of beer (355 mL), one 5 oz glass of wine (148 mL), or one 1 oz glass of hard liquor (44 mL). General instructions Schedule regular health, dental, and eye exams. Stay current with your vaccines. Tell your health care provider if: You often feel depressed. You have ever been abused or do not feel safe at home. Summary Adopting a healthy lifestyle and getting preventive care are important in promoting health and wellness. Follow your health care provider's instructions about healthy diet, exercising, and getting tested or screened for diseases. Follow your health care provider's instructions on monitoring your cholesterol and blood pressure. This information is not intended to replace advice given to you by your health care provider. Make sure you discuss any questions you have with your healthcare provider. Document Revised: 07/10/2018 Document Reviewed: 07/10/2018 [employer]vier Patient Education  2022 [employer]  

## 2021-03-23 NOTE — Progress Notes (Signed)
Subjective:  Patient ID: Michael Miller, male    DOB: 25-Jun-1966  Age: 55 y.o. MRN: [id number]  CC: Annual Exam  This visit occurred during the SARS-CoV-2 public health emergency.  Safety protocols were in place, including screening questions prior to the visit, additional usage of staff PPE, and extensive cleaning of exam room while observing appropriate contact time as indicated for disinfecting solutions.    HPI Michael Miller presents for a CPX and f/up -   He is very active and does hiking.  He does not experience CP, DOE, palpitations, edema, or fatigue.  Outpatient Medications Prior to Visit  Medication Sig Dispense Refill   allopurinol (ZYLOPRIM) 300 MG tablet Take 300 mg by mouth daily.     amLODipine (NORVASC) 5 MG tablet Take 5 mg by mouth daily.     atorvastatin (LIPITOR) 20 MG tablet Take 20 mg by mouth daily.     busPIRone (BUSPAR) 15 MG tablet Take 15 mg by mouth daily.     clonazePAM (KLONOPIN) 0.5 MG tablet TAKE 1 TABLET BY MOUTH EVERY DAY AS NEEDED FOR ANXIETY 30 tablet 2   meloxicam (MOBIC) 15 MG tablet Take 1 tablet (15 mg total) by mouth daily. 90 tablet 1   sertraline (ZOLOFT) 50 MG tablet Take 50 mg by mouth daily.     XYOSTED 75 MG/0.5ML SOAJ Inject 75 mg into the skin once a week. 6 mL 0   montelukast (SINGULAIR) 10 MG tablet TAKE 1 TABLET BY MOUTH EVERYDAY AT BEDTIME 90 tablet 0   No facility-administered medications prior to visit.    ROS Review of Systems  Constitutional:  Negative for chills, diaphoresis, fatigue and fever.  HENT: Negative.    Eyes: Negative.   Respiratory:  Negative for cough, chest tightness, shortness of breath and wheezing.   Cardiovascular:  Negative for chest pain, palpitations and leg swelling.  Gastrointestinal:  Negative for abdominal pain, constipation, diarrhea, nausea and vomiting.  Endocrine: Negative.   Genitourinary:  Positive for dysuria. Negative for difficulty urinating, hematuria, penile discharge, scrotal swelling,  testicular pain and urgency.       Intermittent sharp sensation with urination for one year  Musculoskeletal: Negative.  Negative for arthralgias and myalgias.  Skin: Negative.  Negative for color change.  Neurological: Negative.  Negative for dizziness, weakness, light-headedness and headaches.  Hematological:  Negative for adenopathy. Does not bruise/bleed easily.  Psychiatric/Behavioral: Negative.     Objective:  BP 128/84 (BP Location: Left Arm, Patient Position: Sitting, Cuff Size: Large)   Pulse 64   Temp 97.7 F (36.5 C) (Oral)   Resp 16   Ht '5\' 10"'$  (1.778 m)   Wt 191 lb 6.4 oz (86.8 kg)   SpO2 97%   BMI 27.46 kg/m   BP Readings from Last 3 Encounters:  03/23/21 128/84  12/22/20 138/88  09/13/20 128/82    Wt Readings from Last 3 Encounters:  03/23/21 191 lb 6.4 oz (86.8 kg)  12/22/20 189 lb (85.7 kg)  09/13/20 197 lb 9.6 oz (89.6 kg)    Physical Exam Vitals reviewed.  Constitutional:      Appearance: Normal appearance.  HENT:     Nose: Nose normal.     Mouth/Throat:     Mouth: Mucous membranes are moist.  Eyes:     General: No scleral icterus.    Conjunctiva/sclera: Conjunctivae normal.  Cardiovascular:     Rate and Rhythm: Normal rate and regular rhythm.     Heart sounds: No murmur heard. Pulmonary:  Effort: Pulmonary effort is normal.     Breath sounds: No stridor. No wheezing, rhonchi or rales.  Abdominal:     General: Abdomen is flat.     Palpations: There is no mass.     Tenderness: There is no abdominal tenderness. There is no guarding or rebound.     Hernia: No hernia is present. There is no hernia in the left inguinal area or right inguinal area.  Genitourinary:    Pubic Area: No rash.      Penis: Normal and circumcised.      Testes: Normal.        Right: Tenderness or swelling not present.        Left: Tenderness or swelling not present.     Epididymis:     Right: Normal. Not inflamed or enlarged. No mass.     Left: Normal. Not inflamed  or enlarged. No mass.     Prostate: Enlarged. Not tender and no nodules present.     Rectum: Normal. Guaiac result negative. No mass, tenderness, anal fissure, external hemorrhoid or internal hemorrhoid. Normal anal tone.     Comments: Right prostate lobe is enlarged and boggy Musculoskeletal:        General: Normal range of motion.     Cervical back: Neck supple.  Lymphadenopathy:     Cervical: No cervical adenopathy.     Lower Body: No right inguinal adenopathy. No left inguinal adenopathy.  Skin:    General: Skin is warm and dry.  Neurological:     General: No focal deficit present.     Mental Status: He is alert.  Psychiatric:        Mood and Affect: Mood normal.        Behavior: Behavior normal.    Lab Results  Component Value Date   WBC 7.9 03/23/2021   HGB 16.9 03/23/2021   HCT 48.9 03/23/2021   PLT 239.0 03/23/2021   GLUCOSE 92 03/23/2021   CHOL 173 03/23/2021   TRIG 185.0 (H) 03/23/2021   HDL 48.60 03/23/2021   LDLCALC 87 03/23/2021   ALT 35 03/23/2021   AST 32 03/23/2021   NA 136 03/23/2021   K 4.2 03/23/2021   CL 100 03/23/2021   CREATININE 0.94 03/23/2021   BUN 15 03/23/2021   CO2 25 03/23/2021   TSH 2.07 03/23/2021   PSA 0.50 03/23/2021   HGBA1C 5.3 10/30/2019    Patient was never admitted.  Assessment & Plan:   Michael Miller was seen today for annual exam.  Diagnoses and all orders for this visit:  Encounter for general adult medical examination with abnormal findings- Exam completed, labs reviewed, vaccines are updated, cancer screenings are up-to-date, patient education was given. -     Lipid panel; Future -     PSA; Future -     Hepatitis C antibody; Future -     HIV Antibody (routine testing w rflx); Future -     HIV Antibody (routine testing w rflx) -     Hepatitis C antibody -     PSA -     Lipid panel  Need for hepatitis C screening test -     Hepatitis C antibody; Future -     Hepatitis C antibody  Primary hypertension- His blood  pressure is adequately well controlled -     CBC with Differential/Platelet; Future -     Basic metabolic panel; Future -     Urinalysis, Routine w reflex microscopic; Future -  Urinalysis, Routine w reflex microscopic -     Basic metabolic panel -     CBC with Differential/Platelet  Acquired hypothyroidism- He is euthyroid.  Thyroid replacement therapy is not indicated -     TSH; Future -     TSH  Hyperlipidemia with target LDL less than 130- LDL goal achieved. Doing well on the statin  -     Hepatic function panel; Future -     Hepatic function panel  Idiopathic chronic gout of multiple sites without tophus- He has achieved his uric acid goal. -     Uric acid; Future -     Uric acid  Dysuria- Will screen for infection and will empirically treat for bacterial prostatitis. -     GC/Chlamydia Probe Amp; Future -     GC/Chlamydia Probe Amp  Chronic prostatitis -     sulfamethoxazole-trimethoprim (BACTRIM DS) 800-160 MG tablet; Take 1 tablet by mouth 2 (two) times daily.  I have discontinued Titus Rizor's montelukast. I am also having him start on sulfamethoxazole-trimethoprim. Additionally, I am having him maintain his allopurinol, amLODipine, atorvastatin, busPIRone, sertraline, meloxicam, Xyosted, and clonazePAM.  Meds ordered this encounter  Medications   sulfamethoxazole-trimethoprim (BACTRIM DS) 800-160 MG tablet    Sig: Take 1 tablet by mouth 2 (two) times daily.    Dispense:  60 tablet    Refill:  0      Follow-up: Return in about 3 months (around 06/23/2021).  Scarlette Calico, MD

## 2021-03-24 ENCOUNTER — Encounter: Payer: Self-pay | Admitting: Internal Medicine

## 2021-03-24 LAB — HEPATITIS C ANTIBODY
Hepatitis C Ab: NONREACTIVE
SIGNAL TO CUT-OFF: 0.01 (ref <1.00)

## 2021-03-24 LAB — HIV ANTIBODY (ROUTINE TESTING W REFLEX): HIV 1&2 Ab, 4th Generation: NONREACTIVE

## 2021-03-26 LAB — GC/CHLAMYDIA PROBE AMP
Chlamydia trachomatis, NAA: NEGATIVE
Neisseria Gonorrhoeae by PCR: NEGATIVE

## 2021-03-29 ENCOUNTER — Other Ambulatory Visit: Payer: Self-pay | Admitting: Internal Medicine

## 2021-03-29 DIAGNOSIS — E291 Testicular hypofunction: Secondary | ICD-10-CM

## 2021-03-29 MED ORDER — XYOSTED 75 MG/0.5ML ~~LOC~~ SOAJ: 75.0000 mg | SUBCUTANEOUS | 0 refills | Status: DC

## 2021-04-01 ENCOUNTER — Other Ambulatory Visit: Payer: Self-pay | Admitting: Internal Medicine

## 2021-04-30 ENCOUNTER — Other Ambulatory Visit: Payer: Self-pay | Admitting: Internal Medicine

## 2021-04-30 DIAGNOSIS — E291 Testicular hypofunction: Secondary | ICD-10-CM

## 2021-05-02 MED ORDER — XYOSTED 75 MG/0.5ML ~~LOC~~ SOAJ: 75.0000 mg | SUBCUTANEOUS | 0 refills | Status: DC

## 2021-05-08 ENCOUNTER — Other Ambulatory Visit: Payer: Self-pay | Admitting: Internal Medicine

## 2021-05-08 DIAGNOSIS — E291 Testicular hypofunction: Secondary | ICD-10-CM

## 2021-05-09 ENCOUNTER — Other Ambulatory Visit: Payer: Self-pay | Admitting: Internal Medicine

## 2021-05-09 DIAGNOSIS — E291 Testicular hypofunction: Secondary | ICD-10-CM

## 2021-05-09 MED ORDER — XYOSTED 75 MG/0.5ML ~~LOC~~ SOAJ: 75.0000 mg | SUBCUTANEOUS | 0 refills | Status: DC

## 2021-05-10 ENCOUNTER — Encounter: Payer: Self-pay | Admitting: Internal Medicine

## 2021-05-30 ENCOUNTER — Other Ambulatory Visit: Payer: Self-pay | Admitting: Internal Medicine

## 2021-05-30 ENCOUNTER — Encounter: Payer: Self-pay | Admitting: Internal Medicine

## 2021-05-30 ENCOUNTER — Ambulatory Visit (INDEPENDENT_AMBULATORY_CARE_PROVIDER_SITE_OTHER): Payer: 59 | Admitting: Internal Medicine

## 2021-05-30 ENCOUNTER — Other Ambulatory Visit: Payer: Self-pay

## 2021-05-30 VITALS — BP 132/86 | HR 65 | Temp 97.8°F | Resp 16 | Ht 70.0 in | Wt 189.0 lb

## 2021-05-30 DIAGNOSIS — K648 Other hemorrhoids: Secondary | ICD-10-CM

## 2021-05-30 DIAGNOSIS — M1A09X0 Idiopathic chronic gout, multiple sites, without tophus (tophi): Secondary | ICD-10-CM | POA: Diagnosis not present

## 2021-05-30 DIAGNOSIS — K580 Irritable bowel syndrome with diarrhea: Secondary | ICD-10-CM | POA: Diagnosis not present

## 2021-05-30 DIAGNOSIS — I10 Essential (primary) hypertension: Secondary | ICD-10-CM

## 2021-05-30 DIAGNOSIS — F411 Generalized anxiety disorder: Secondary | ICD-10-CM | POA: Diagnosis not present

## 2021-05-30 DIAGNOSIS — N522 Drug-induced erectile dysfunction: Secondary | ICD-10-CM | POA: Insufficient documentation

## 2021-05-30 MED ORDER — TADALAFIL 20 MG PO TABS: 20.0000 mg | ORAL_TABLET | ORAL | 3 refills | Status: DC | PRN

## 2021-05-30 MED ORDER — ALLOPURINOL 300 MG PO TABS: 300.0000 mg | ORAL_TABLET | Freq: Every day | ORAL | 1 refills | Status: DC

## 2021-05-30 MED ORDER — HYDROCORT-PRAMOXINE (PERIANAL) 1-1 % EX FOAM: 1.0000 | Freq: Two times a day (BID) | CUTANEOUS | 2 refills | Status: DC

## 2021-05-30 MED ORDER — AMLODIPINE BESYLATE 5 MG PO TABS: 5.0000 mg | ORAL_TABLET | Freq: Every day | ORAL | 1 refills | Status: DC

## 2021-05-30 MED ORDER — BUSPIRONE HCL 10 MG PO TABS: 10.0000 mg | ORAL_TABLET | Freq: Two times a day (BID) | ORAL | 1 refills | Status: DC

## 2021-05-30 MED ORDER — RIFAXIMIN 550 MG PO TABS: 550.0000 mg | ORAL_TABLET | Freq: Three times a day (TID) | ORAL | 0 refills | Status: DC

## 2021-05-30 NOTE — Addendum Note (Signed)
Addended by: [name] on: 05/30/2021 02:40 PM   Modules accepted: Orders

## 2021-05-30 NOTE — Progress Notes (Signed)
Subjective:  Patient ID: Michael Miller, male    DOB: [birth year]  Age: 55 y.o. MRN: [id number]  CC: Hypertension  This visit occurred during the SARS-CoV-2 public health emergency.  Safety protocols were in place, including screening questions prior to the visit, additional usage of staff PPE, and extensive cleaning of exam room while observing appropriate contact time as indicated for disinfecting solutions.    HPI Akil Hoos presents for f/up -  He complains of a several week hx of watery, foul-smelling diarrhea, cramps, and anal itching. He would like to lower the buspar dose.  Outpatient Medications Prior to Visit  Medication Sig Dispense Refill   atorvastatin (LIPITOR) 20 MG tablet Take 20 mg by mouth daily.     clonazePAM (KLONOPIN) 0.5 MG tablet TAKE 1 TABLET BY MOUTH EVERY DAY AS NEEDED FOR ANXIETY 30 tablet 2   meloxicam (MOBIC) 15 MG tablet Take 1 tablet (15 mg total) by mouth daily. 90 tablet 1   montelukast (SINGULAIR) 10 MG tablet TAKE 1 TABLET BY MOUTH EVERYDAY AT BEDTIME 90 tablet 0   sertraline (ZOLOFT) 50 MG tablet Take 50 mg by mouth daily.     XYOSTED 75 MG/0.5ML SOAJ Inject 75 mg into the skin once a week. 6 mL 0   allopurinol (ZYLOPRIM) 300 MG tablet Take 300 mg by mouth daily.     amLODipine (NORVASC) 5 MG tablet Take 5 mg by mouth daily.     busPIRone (BUSPAR) 15 MG tablet Take 15 mg by mouth daily.     No facility-administered medications prior to visit.    ROS Review of Systems  Constitutional:  Negative for chills, diaphoresis, fatigue and fever.  HENT: Negative.    Eyes: Negative.   Respiratory:  Negative for cough, chest tightness, shortness of breath and wheezing.   Cardiovascular:  Negative for chest pain, palpitations and leg swelling.  Gastrointestinal:  Positive for diarrhea. Negative for abdominal pain, blood in stool, constipation, nausea, rectal pain and vomiting.  Endocrine: Negative.   Genitourinary: Negative.  Negative for difficulty urinating  and dysuria.       ++ED  Musculoskeletal: Negative.  Negative for arthralgias.  Skin: Negative.  Negative for color change, pallor and rash.  Neurological: Negative.  Negative for dizziness, weakness, light-headedness, numbness and headaches.  Hematological:  Negative for adenopathy. Does not bruise/bleed easily.  Psychiatric/Behavioral:  Negative for confusion, decreased concentration, dysphoric mood and sleep disturbance. The patient is nervous/anxious.    Objective:  BP 132/86 (BP Location: Left Arm, Patient Position: Sitting, Cuff Size: Large)   Pulse 65   Temp 97.8 F (36.6 C) (Oral)   Resp 16   Ht 5\' 10"  (1.778 m)   Wt 189 lb (85.7 kg)   SpO2 98%   BMI 27.12 kg/m   BP Readings from Last 3 Encounters:  05/30/21 132/86  03/23/21 128/84  12/22/20 138/88    Wt Readings from Last 3 Encounters:  05/30/21 189 lb (85.7 kg)  03/23/21 191 lb 6.4 oz (86.8 kg)  12/22/20 189 lb (85.7 kg)    Physical Exam Vitals reviewed.  HENT:     Nose: Nose normal.     Mouth/Throat:     Mouth: Mucous membranes are moist.  Eyes:     Conjunctiva/sclera: Conjunctivae normal.  Cardiovascular:     Rate and Rhythm: Normal rate.     Heart sounds: No murmur heard. Pulmonary:     Effort: Pulmonary effort is normal.     Breath sounds: No stridor. No wheezing,  rhonchi or rales.  Abdominal:     General: Abdomen is flat. Bowel sounds are normal. There is no distension.     Palpations: Abdomen is soft. There is no hepatomegaly, splenomegaly or mass.     Tenderness: There is no abdominal tenderness.  Musculoskeletal:        General: Normal range of motion.     Cervical back: Neck supple.     Right lower leg: No edema.  Lymphadenopathy:     Cervical: No cervical adenopathy.  Skin:    General: Skin is warm and dry.  Neurological:     General: No focal deficit present.     Mental Status: He is alert.  Psychiatric:        Mood and Affect: Mood normal.        Behavior: Behavior normal.    Lab  Results  Component Value Date   WBC 7.9 03/23/2021   HGB 16.9 03/23/2021   HCT 48.9 03/23/2021   PLT 239.0 03/23/2021   GLUCOSE 92 03/23/2021   CHOL 173 03/23/2021   TRIG 185.0 (H) 03/23/2021   HDL 48.60 03/23/2021   LDLCALC 87 03/23/2021   ALT 35 03/23/2021   AST 32 03/23/2021   NA 136 03/23/2021   K 4.2 03/23/2021   CL 100 03/23/2021   CREATININE 0.94 03/23/2021   BUN 15 03/23/2021   CO2 25 03/23/2021   TSH 2.07 03/23/2021   PSA 0.50 03/23/2021   HGBA1C 5.3 10/30/2019    Patient was never admitted.  Assessment & Plan:   Juvencio was seen today for hypertension.  Diagnoses and all orders for this visit:  Primary hypertension- His BP is adequately well controlled. -     amLODipine (NORVASC) 5 MG tablet; Take 1 tablet (5 mg total) by mouth daily.  Idiopathic chronic gout of multiple sites without tophus -     allopurinol (ZYLOPRIM) 300 MG tablet; Take 1 tablet (300 mg total) by mouth daily.  GAD (generalized anxiety disorder) -     busPIRone (BUSPAR) 10 MG tablet; Take 1 tablet (10 mg total) by mouth 2 (two) times daily.  Irritable bowel syndrome with diarrhea -     rifaximin (XIFAXAN) 550 MG TABS tablet; Take 1 tablet (550 mg total) by mouth 3 (three) times daily for 14 days.  Inflamed internal hemorrhoid -     hydrocortisone-pramoxine (PROCTOFOAM-HC) rectal foam; Place 1 applicator rectally 2 (two) times daily.  Drug-induced erectile dysfunction -     tadalafil (CIALIS) 20 MG tablet; Take 1 tablet (20 mg total) by mouth every other day as needed for erectile dysfunction.  I have discontinued Lennette Bihari Rutledge's busPIRone. I have also changed his allopurinol and amLODipine. Additionally, I am having him start on busPIRone, rifaximin, hydrocortisone-pramoxine, and tadalafil. Lastly, I am having him maintain his atorvastatin, sertraline, meloxicam, clonazePAM, montelukast, and Xyosted.  Meds ordered this encounter  Medications   allopurinol (ZYLOPRIM) 300 MG tablet    Sig:  Take 1 tablet (300 mg total) by mouth daily.    Dispense:  90 tablet    Refill:  1   amLODipine (NORVASC) 5 MG tablet    Sig: Take 1 tablet (5 mg total) by mouth daily.    Dispense:  90 tablet    Refill:  1   busPIRone (BUSPAR) 10 MG tablet    Sig: Take 1 tablet (10 mg total) by mouth 2 (two) times daily.    Dispense:  180 tablet    Refill:  1   rifaximin (  XIFAXAN) 550 MG TABS tablet    Sig: Take 1 tablet (550 mg total) by mouth 3 (three) times daily for 14 days.    Dispense:  42 tablet    Refill:  0   hydrocortisone-pramoxine (PROCTOFOAM-HC) rectal foam    Sig: Place 1 applicator rectally 2 (two) times daily.    Dispense:  10 g    Refill:  2   tadalafil (CIALIS) 20 MG tablet    Sig: Take 1 tablet (20 mg total) by mouth every other day as needed for erectile dysfunction.    Dispense:  6 tablet    Refill:  3     Follow-up: Return in about 6 months (around 11/27/2021).  Scarlette Calico, MD

## 2021-05-31 ENCOUNTER — Encounter: Payer: Self-pay | Admitting: Internal Medicine

## 2021-05-31 DIAGNOSIS — N522 Drug-induced erectile dysfunction: Secondary | ICD-10-CM

## 2021-05-31 MED ORDER — RIFAXIMIN 550 MG PO TABS: 550.0000 mg | ORAL_TABLET | Freq: Three times a day (TID) | ORAL | 0 refills | Status: DC

## 2021-05-31 MED ORDER — TADALAFIL 20 MG PO TABS
20.0000 mg | ORAL_TABLET | ORAL | 3 refills | Status: DC | PRN
Start: 2021-05-31 — End: 2021-10-11

## 2021-06-01 ENCOUNTER — Encounter: Payer: Self-pay | Admitting: Internal Medicine

## 2021-06-01 NOTE — Telephone Encounter (Signed)
Key: [id number]   Approved 05/02/2021-08/15/2020

## 2021-06-02 ENCOUNTER — Other Ambulatory Visit: Payer: Self-pay | Admitting: Internal Medicine

## 2021-06-06 ENCOUNTER — Other Ambulatory Visit: Payer: Self-pay | Admitting: Internal Medicine

## 2021-06-06 DIAGNOSIS — K580 Irritable bowel syndrome with diarrhea: Secondary | ICD-10-CM

## 2021-06-06 MED ORDER — VIBERZI 75 MG PO TABS: 75.0000 mg | ORAL_TABLET | Freq: Two times a day (BID) | ORAL | 1 refills | Status: DC

## 2021-06-07 ENCOUNTER — Ambulatory Visit: Payer: 59 | Admitting: Internal Medicine

## 2021-06-08 ENCOUNTER — Encounter: Payer: Self-pay | Admitting: Internal Medicine

## 2021-06-08 ENCOUNTER — Other Ambulatory Visit: Payer: Self-pay | Admitting: Internal Medicine

## 2021-06-08 DIAGNOSIS — K648 Other hemorrhoids: Secondary | ICD-10-CM

## 2021-06-08 MED ORDER — HYDROCORT-PRAMOXINE (PERIANAL) 1-1 % EX FOAM: 1.0000 | Freq: Two times a day (BID) | CUTANEOUS | 2 refills | Status: DC

## 2021-06-10 ENCOUNTER — Other Ambulatory Visit: Payer: Self-pay | Admitting: Internal Medicine

## 2021-06-10 ENCOUNTER — Telehealth: Payer: Self-pay | Admitting: Internal Medicine

## 2021-06-10 DIAGNOSIS — K648 Other hemorrhoids: Secondary | ICD-10-CM

## 2021-06-10 MED ORDER — PRAMOXINE HCL (PERIANAL) 1 % EX FOAM: 1.0000 "application " | Freq: Three times a day (TID) | CUTANEOUS | 2 refills | Status: DC | PRN

## 2021-06-10 NOTE — Telephone Encounter (Signed)
Alma Downs from [employer] has called and states that hydrocortisone-pramoxine [facility]) rectal foam is not covered under pt. Insurance. States that the cream will be covered- 30G 1%/1%, as well as Hydrocortisone cream 1%. Requesting a change to form of medication. Requesting change ASAP to ensure it stays in their system.    Please advise.    Callback #- [id number]  Reference #- [id number]

## 2021-06-13 ENCOUNTER — Other Ambulatory Visit: Payer: Self-pay | Admitting: *Deleted

## 2021-06-13 DIAGNOSIS — I10 Essential (primary) hypertension: Secondary | ICD-10-CM

## 2021-06-13 MED ORDER — AMLODIPINE BESYLATE 5 MG PO TABS: 5.0000 mg | ORAL_TABLET | Freq: Every day | ORAL | 1 refills | Status: DC

## 2021-06-26 ENCOUNTER — Other Ambulatory Visit: Payer: Self-pay | Admitting: Internal Medicine

## 2021-06-26 DIAGNOSIS — E291 Testicular hypofunction: Secondary | ICD-10-CM

## 2021-06-27 ENCOUNTER — Encounter: Payer: Self-pay | Admitting: Internal Medicine

## 2021-06-27 ENCOUNTER — Other Ambulatory Visit: Payer: Self-pay | Admitting: Internal Medicine

## 2021-06-27 DIAGNOSIS — F411 Generalized anxiety disorder: Secondary | ICD-10-CM

## 2021-06-27 DIAGNOSIS — E291 Testicular hypofunction: Secondary | ICD-10-CM

## 2021-06-27 MED ORDER — XYOSTED 75 MG/0.5ML ~~LOC~~ SOAJ: 75.0000 mg | SUBCUTANEOUS | 0 refills | Status: DC

## 2021-06-27 MED ORDER — CLONAZEPAM 0.5 MG PO TABS: ORAL_TABLET | ORAL | 2 refills | Status: DC

## 2021-06-28 ENCOUNTER — Other Ambulatory Visit: Payer: Self-pay

## 2021-06-28 ENCOUNTER — Ambulatory Visit (INDEPENDENT_AMBULATORY_CARE_PROVIDER_SITE_OTHER): Payer: 59 | Admitting: Internal Medicine

## 2021-06-28 ENCOUNTER — Ambulatory Visit (INDEPENDENT_AMBULATORY_CARE_PROVIDER_SITE_OTHER): Payer: 59

## 2021-06-28 ENCOUNTER — Encounter: Payer: Self-pay | Admitting: Internal Medicine

## 2021-06-28 VITALS — BP 124/82 | HR 70 | Temp 98.2°F | Ht 70.0 in | Wt 191.0 lb

## 2021-06-28 DIAGNOSIS — U071 COVID-19: Secondary | ICD-10-CM

## 2021-06-28 DIAGNOSIS — J45909 Unspecified asthma, uncomplicated: Secondary | ICD-10-CM | POA: Insufficient documentation

## 2021-06-28 DIAGNOSIS — J4531 Mild persistent asthma with (acute) exacerbation: Secondary | ICD-10-CM

## 2021-06-28 DIAGNOSIS — I10 Essential (primary) hypertension: Secondary | ICD-10-CM | POA: Diagnosis not present

## 2021-06-28 DIAGNOSIS — R052 Subacute cough: Secondary | ICD-10-CM

## 2021-06-28 DIAGNOSIS — J40 Bronchitis, not specified as acute or chronic: Secondary | ICD-10-CM | POA: Diagnosis not present

## 2021-06-28 LAB — SARS-COV-2 IGG: SARS-COV-2 IgG: 21.78

## 2021-06-28 MED ORDER — HYDROCODONE BIT-HOMATROP MBR 5-1.5 MG/5ML PO SOLN: 5.0000 mL | Freq: Three times a day (TID) | ORAL | 0 refills | Status: AC | PRN

## 2021-06-28 MED ORDER — ALBUTEROL SULFATE HFA 108 (90 BASE) MCG/ACT IN AERS
2.0000 | INHALATION_SPRAY | Freq: Four times a day (QID) | RESPIRATORY_TRACT | 1 refills | Status: DC | PRN
Start: 2021-06-28 — End: 2021-08-16

## 2021-06-28 MED ORDER — METHYLPREDNISOLONE 4 MG PO TBPK: ORAL_TABLET | ORAL | 0 refills | Status: AC

## 2021-06-28 NOTE — Progress Notes (Signed)
Subjective:  Patient ID: Michael Miller, male    DOB: 05/26/1966  Age: 55 y.o. MRN: [id number]  CC: Cough  This visit occurred during the SARS-CoV-2 public health emergency.  Safety protocols were in place, including screening questions prior to the visit, additional usage of staff PPE, and extensive cleaning of exam room while observing appropriate contact time as indicated for disinfecting solutions.    HPI Michael Miller presents for f/up -   He complains of a 10-day history of cough that is productive of scant amount of yellow phlegm.  He has had wheezing and night sweats but denies fever, chills, chest pain, shortness of breath, or hemoptysis.  Outpatient Medications Prior to Visit  Medication Sig Dispense Refill   allopurinol (ZYLOPRIM) 300 MG tablet Take 1 tablet (300 mg total) by mouth daily. 90 tablet 1   amLODipine (NORVASC) 5 MG tablet Take 1 tablet (5 mg total) by mouth daily. 90 tablet 1   atorvastatin (LIPITOR) 20 MG tablet Take 20 mg by mouth daily.     busPIRone (BUSPAR) 10 MG tablet Take 1 tablet (10 mg total) by mouth 2 (two) times daily. 180 tablet 1   clonazePAM (KLONOPIN) 0.5 MG tablet TAKE 1 TABLET BY MOUTH EVERY DAY AS NEEDED FOR ANXIETY 30 tablet 2   Eluxadoline (VIBERZI) 75 MG TABS Take 75 mg by mouth 2 (two) times daily with a meal. 180 tablet 1   pramoxine (PROCTOFOAM) 1 % foam Place 1 application rectally 3 (three) times daily as needed for anal itching. 15 g 2   sertraline (ZOLOFT) 50 MG tablet Take 50 mg by mouth daily.     tadalafil (CIALIS) 20 MG tablet Take 1 tablet (20 mg total) by mouth every other day as needed for erectile dysfunction. 6 tablet 3   XYOSTED 75 MG/0.5ML SOAJ Inject 75 mg into the skin once a week. 6 mL 0   No facility-administered medications prior to visit.    ROS Review of Systems  Constitutional:  Negative for chills, diaphoresis, fatigue and fever.  HENT:  Negative for sore throat.   Eyes: Negative.   Respiratory:  Positive for cough  and wheezing. Negative for chest tightness and shortness of breath.   Cardiovascular:  Negative for palpitations and leg swelling.  Gastrointestinal:  Negative for abdominal pain, diarrhea, nausea and vomiting.  Endocrine: Negative.   Genitourinary: Negative.  Negative for difficulty urinating and dysuria.  Musculoskeletal: Negative.   Skin: Negative.   Neurological:  Negative for dizziness, weakness and light-headedness.  Hematological:  Negative for adenopathy. Does not bruise/bleed easily.  Psychiatric/Behavioral: Negative.     Objective:  BP 124/82 (BP Location: Left Arm, Patient Position: Sitting, Cuff Size: Large)   Pulse 70   Temp 98.2 F (36.8 C) (Oral)   Ht 5\' 10"  (1.778 m)   Wt 191 lb (86.6 kg)   SpO2 96%   BMI 27.41 kg/m   BP Readings from Last 3 Encounters:  06/28/21 124/82  05/30/21 132/86  03/23/21 128/84    Wt Readings from Last 3 Encounters:  06/28/21 191 lb (86.6 kg)  05/30/21 189 lb (85.7 kg)  03/23/21 191 lb 6.4 oz (86.8 kg)    Physical Exam Vitals reviewed.  Constitutional:      General: He is not in acute distress.    Appearance: Normal appearance. He is not ill-appearing, toxic-appearing or diaphoretic.  HENT:     Nose: Nose normal.     Mouth/Throat:     Mouth: Mucous membranes are moist.  Eyes:     General: No scleral icterus.    Conjunctiva/sclera: Conjunctivae normal.  Cardiovascular:     Rate and Rhythm: Normal rate and regular rhythm.     Heart sounds: No murmur heard. Pulmonary:     Effort: Pulmonary effort is normal.     Breath sounds: No stridor. No wheezing, rhonchi or rales.  Abdominal:     General: Abdomen is flat.     Palpations: There is no mass.     Tenderness: There is no abdominal tenderness. There is no guarding.     Hernia: No hernia is present.  Musculoskeletal:        General: Normal range of motion.     Cervical back: Neck supple.     Right lower leg: No edema.     Left lower leg: No edema.  Skin:    General:  Skin is warm and dry.  Neurological:     General: No focal deficit present.     Mental Status: He is alert.  Psychiatric:        Mood and Affect: Mood normal.        Behavior: Behavior normal.    Lab Results  Component Value Date   WBC 7.9 03/23/2021   HGB 16.9 03/23/2021   HCT 48.9 03/23/2021   PLT 239.0 03/23/2021   GLUCOSE 92 03/23/2021   CHOL 173 03/23/2021   TRIG 185.0 (H) 03/23/2021   HDL 48.60 03/23/2021   LDLCALC 87 03/23/2021   ALT 35 03/23/2021   AST 32 03/23/2021   NA 136 03/23/2021   K 4.2 03/23/2021   CL 100 03/23/2021   CREATININE 0.94 03/23/2021   BUN 15 03/23/2021   CO2 25 03/23/2021   TSH 2.07 03/23/2021   PSA 0.50 03/23/2021   HGBA1C 5.3 10/30/2019    DG Chest 2 View  Result Date: 06/28/2021 CLINICAL DATA:  Cough for 10 days EXAM: CHEST - 2 VIEW COMPARISON:  None. FINDINGS: The heart size and mediastinal contours are within normal limits. Both lungs are clear. The visualized skeletal structures are unremarkable. IMPRESSION: No active cardiopulmonary disease. Electronically Signed   By: Donavan Foil M.D.   On: 06/28/2021 15:38     Assessment & Plan:   Michael Miller was seen today for cough.  Diagnoses and all orders for this visit:  Subacute cough- His chest x-ray is negative for mass or infiltrate.  Will treat for acute asthmatic bronchitis with albuterol and methylprednisolone.  Will screen for RSV with an antibody test.  His COVID-19 antibody is positive but he has presented too late to benefit from antivirals.  Will treat symptomatically. -     RSV(respiratory syncytial virus) ab, bld; Future -     SARS-COV-2 IgG; Future -     DG Chest 2 View; Future -     SARS-COV-2 IgG -     RSV(respiratory syncytial virus) ab, bld -     HYDROcodone bit-homatropine (HYCODAN) 5-1.5 MG/5ML syrup; Take 5 mLs by mouth every 8 (eight) hours as needed for up to 7 days for cough.  Mild persistent asthmatic bronchitis with acute exacerbation- See above. -     albuterol  (VENTOLIN HFA) 108 (90 Base) MCG/ACT inhaler; Inhale 2 puffs into the lungs every 6 (six) hours as needed for wheezing or shortness of breath. -     methylPREDNISolone (MEDROL DOSEPAK) 4 MG TBPK tablet; TAKE AS DIRECTED  Bronchitis due to COVID-19 virus- See above.  I am having Michael Miller start on albuterol, HYDROcodone  bit-homatropine, and methylPREDNISolone. I am also having him maintain his atorvastatin, sertraline, allopurinol, busPIRone, tadalafil, Viberzi, pramoxine, amLODipine, clonazePAM, and Xyosted.  Meds ordered this encounter  Medications   albuterol (VENTOLIN HFA) 108 (90 Base) MCG/ACT inhaler    Sig: Inhale 2 puffs into the lungs every 6 (six) hours as needed for wheezing or shortness of breath.    Dispense:  18 g    Refill:  1   HYDROcodone bit-homatropine (HYCODAN) 5-1.5 MG/5ML syrup    Sig: Take 5 mLs by mouth every 8 (eight) hours as needed for up to 7 days for cough.    Dispense:  120 mL    Refill:  0   methylPREDNISolone (MEDROL DOSEPAK) 4 MG TBPK tablet    Sig: TAKE AS DIRECTED    Dispense:  21 tablet    Refill:  0     Follow-up: Return if symptoms worsen or fail to improve.  Scarlette Calico, MD

## 2021-06-28 NOTE — Telephone Encounter (Signed)
Patient called in regarding mychart messages he sent to provider  Scheduled patient for this afternoon 11/29 @ 3pm

## 2021-06-28 NOTE — Patient Instructions (Signed)

## 2021-07-01 ENCOUNTER — Encounter: Payer: Self-pay | Admitting: Internal Medicine

## 2021-07-02 DIAGNOSIS — U071 COVID-19: Secondary | ICD-10-CM | POA: Insufficient documentation

## 2021-07-02 DIAGNOSIS — J40 Bronchitis, not specified as acute or chronic: Secondary | ICD-10-CM | POA: Insufficient documentation

## 2021-07-06 LAB — RSV(RESPIRATORY SYNCYTIAL VIRUS) AB, BLOOD: RSV Antibodies: >=1:64 {titer} — AB

## 2021-07-15 ENCOUNTER — Other Ambulatory Visit: Payer: Self-pay | Admitting: Internal Medicine

## 2021-07-15 DIAGNOSIS — E291 Testicular hypofunction: Secondary | ICD-10-CM

## 2021-07-15 MED ORDER — XYOSTED 75 MG/0.5ML ~~LOC~~ SOAJ: 75.0000 mg | SUBCUTANEOUS | 0 refills | Status: DC

## 2021-08-03 ENCOUNTER — Encounter: Payer: Self-pay | Admitting: Internal Medicine

## 2021-08-07 ENCOUNTER — Other Ambulatory Visit: Payer: Self-pay | Admitting: Internal Medicine

## 2021-08-07 ENCOUNTER — Encounter: Payer: Self-pay | Admitting: Internal Medicine

## 2021-08-08 ENCOUNTER — Other Ambulatory Visit: Payer: Self-pay | Admitting: Internal Medicine

## 2021-08-08 ENCOUNTER — Other Ambulatory Visit: Payer: Self-pay

## 2021-08-08 DIAGNOSIS — M1811 Unilateral primary osteoarthritis of first carpometacarpal joint, right hand: Secondary | ICD-10-CM | POA: Diagnosis not present

## 2021-08-08 DIAGNOSIS — M13841 Other specified arthritis, right hand: Secondary | ICD-10-CM | POA: Diagnosis not present

## 2021-08-08 DIAGNOSIS — G8918 Other acute postprocedural pain: Secondary | ICD-10-CM | POA: Diagnosis not present

## 2021-08-08 DIAGNOSIS — E785 Hyperlipidemia, unspecified: Secondary | ICD-10-CM

## 2021-08-08 HISTORY — PX: ARTHRODESIS: SHX136

## 2021-08-08 MED ORDER — ATORVASTATIN CALCIUM 20 MG PO TABS
20.0000 mg | ORAL_TABLET | Freq: Every day | ORAL | 1 refills | Status: DC
  Filled 2021-08-08: qty 30, 30d supply, fill #0

## 2021-08-08 MED ORDER — ATORVASTATIN CALCIUM 20 MG PO TABS: 20.0000 mg | ORAL_TABLET | Freq: Every day | ORAL | 1 refills | Status: DC

## 2021-08-11 ENCOUNTER — Other Ambulatory Visit: Payer: Self-pay | Admitting: Internal Medicine

## 2021-08-11 DIAGNOSIS — E291 Testicular hypofunction: Secondary | ICD-10-CM

## 2021-08-16 ENCOUNTER — Other Ambulatory Visit: Payer: Self-pay | Admitting: Internal Medicine

## 2021-08-16 DIAGNOSIS — J4531 Mild persistent asthma with (acute) exacerbation: Secondary | ICD-10-CM

## 2021-08-18 ENCOUNTER — Encounter: Payer: Self-pay | Admitting: Internal Medicine

## 2021-08-19 DIAGNOSIS — M13841 Other specified arthritis, right hand: Secondary | ICD-10-CM | POA: Diagnosis not present

## 2021-08-19 NOTE — Telephone Encounter (Signed)
Key: [id number]

## 2021-08-25 ENCOUNTER — Encounter: Payer: Self-pay | Admitting: Internal Medicine

## 2021-08-25 ENCOUNTER — Other Ambulatory Visit: Payer: Self-pay

## 2021-08-25 ENCOUNTER — Ambulatory Visit (INDEPENDENT_AMBULATORY_CARE_PROVIDER_SITE_OTHER): Payer: BC Managed Care – PPO | Admitting: Internal Medicine

## 2021-08-25 VITALS — BP 142/86 | HR 87 | Temp 98.1°F | Resp 16 | Ht 70.0 in | Wt 188.0 lb

## 2021-08-25 DIAGNOSIS — E039 Hypothyroidism, unspecified: Secondary | ICD-10-CM

## 2021-08-25 DIAGNOSIS — I10 Essential (primary) hypertension: Secondary | ICD-10-CM

## 2021-08-25 DIAGNOSIS — N522 Drug-induced erectile dysfunction: Secondary | ICD-10-CM

## 2021-08-25 DIAGNOSIS — J3089 Other allergic rhinitis: Secondary | ICD-10-CM | POA: Diagnosis not present

## 2021-08-25 DIAGNOSIS — E291 Testicular hypofunction: Secondary | ICD-10-CM | POA: Diagnosis not present

## 2021-08-25 DIAGNOSIS — I452 Bifascicular block: Secondary | ICD-10-CM

## 2021-08-25 LAB — CBC WITH DIFFERENTIAL/PLATELET
Basophils Absolute: 0 10*3/uL (ref 0.0–0.1)
Basophils Relative: 0.7 % (ref 0.0–3.0)
Eosinophils Absolute: 0.1 10*3/uL (ref 0.0–0.7)
Eosinophils Relative: 2.2 % (ref 0.0–5.0)
HCT: 50 % (ref 39.0–52.0)
Hemoglobin: 16.8 g/dL (ref 13.0–17.0)
Lymphocytes Relative: 33.4 % (ref 12.0–46.0)
Lymphs Abs: 2 10*3/uL (ref 0.7–4.0)
MCHC: 33.7 g/dL (ref 30.0–36.0)
MCV: 89.3 fl (ref 78.0–100.0)
Monocytes Absolute: 0.5 10*3/uL (ref 0.1–1.0)
Monocytes Relative: 7.7 % (ref 3.0–12.0)
Neutro Abs: 3.4 10*3/uL (ref 1.4–7.7)
Neutrophils Relative %: 56 % (ref 43.0–77.0)
Platelets: 304 10*3/uL (ref 150.0–400.0)
RBC: 5.59 Mil/uL (ref 4.22–5.81)
RDW: 13.3 % (ref 11.5–15.5)
WBC: 6.1 10*3/uL (ref 4.0–10.5)

## 2021-08-25 LAB — BASIC METABOLIC PANEL
BUN: 19 mg/dL (ref 6–23)
CO2: 29 mEq/L (ref 19–32)
Calcium: 9.9 mg/dL (ref 8.4–10.5)
Chloride: 102 mEq/L (ref 96–112)
Creatinine, Ser: 1.15 mg/dL (ref 0.40–1.50)
GFR: 71.05 mL/min (ref >60.00)
Glucose, Bld: 92 mg/dL (ref 70–99)
Potassium: 3.9 mEq/L (ref 3.5–5.1)
Sodium: 140 mEq/L (ref 135–145)

## 2021-08-25 LAB — TSH: TSH: 1.69 u[IU]/mL (ref 0.35–5.50)

## 2021-08-25 NOTE — Progress Notes (Signed)
Subjective:  Patient ID: Michael Miller, male    DOB: 05-Mar-1966  Age: 56 y.o. MRN: [id number]  CC: Hypertension  This visit occurred during the SARS-CoV-2 public health emergency.  Safety protocols were in place, including screening questions prior to the visit, additional usage of staff PPE, and extensive cleaning of exam room while observing appropriate contact time as indicated for disinfecting solutions.    HPI [name] presents for f/up -   He has a history of right bundle branch block and left anterior fascicular block.  He tells me he had this evaluated 10 years ago by a cardiologist in [place].  He denies chest pain, shortness of breath, or diaphoresis.  He would like to see a cardiologist again to make sure his heart is okay.  He continues to complain of fatigue, low libido, and erectile dysfunction.  He has chronic allergic rhinitis and would like to see an allergist to be tested.  Outpatient Medications Prior to Visit  Medication Sig Dispense Refill   albuterol (VENTOLIN HFA) 108 (90 Base) MCG/ACT inhaler TAKE 2 PUFFS BY MOUTH EVERY 6 HOURS AS NEEDED FOR WHEEZE OR SHORTNESS OF BREATH 8.5 each 1   allopurinol (ZYLOPRIM) 300 MG tablet Take 1 tablet (300 mg total) by mouth daily. 90 tablet 1   amLODipine (NORVASC) 5 MG tablet Take 1 tablet (5 mg total) by mouth daily. 90 tablet 1   atorvastatin (LIPITOR) 20 MG tablet Take 1 tablet (20 mg total) by mouth daily. 90 tablet 1   busPIRone (BUSPAR) 10 MG tablet Take 1 tablet (10 mg total) by mouth 2 (two) times daily. 180 tablet 1   clonazePAM (KLONOPIN) 0.5 MG tablet TAKE 1 TABLET BY MOUTH EVERY DAY AS NEEDED FOR ANXIETY 30 tablet 2   Eluxadoline (VIBERZI) 75 MG TABS Take 75 mg by mouth 2 (two) times daily with a meal. 180 tablet 1   pramoxine (PROCTOFOAM) 1 % foam Place 1 application rectally 3 (three) times daily as needed for anal itching. 15 g 2   sertraline (ZOLOFT) 50 MG tablet Take 50 mg by mouth daily.     tadalafil (CIALIS) 20 MG  tablet Take 1 tablet (20 mg total) by mouth every other day as needed for erectile dysfunction. 6 tablet 3   XYOSTED 75 MG/0.5ML SOAJ INJECT 75 MG INTO THE SKIN ONCE A WEEK. 6 mL 0   No facility-administered medications prior to visit.    ROS Review of Systems  Constitutional:  Positive for fatigue. Negative for appetite change, diaphoresis and unexpected weight change.  HENT: Negative.    Eyes: Negative.   Respiratory:  Negative for cough, shortness of breath and wheezing.   Cardiovascular:  Negative for chest pain, palpitations and leg swelling.  Gastrointestinal:  Negative for abdominal pain, constipation, diarrhea and nausea.  Endocrine: Negative.  Negative for cold intolerance and heat intolerance.  Genitourinary: Negative.  Negative for difficulty urinating.       ++low libido and ED  Musculoskeletal: Negative.  Negative for myalgias.  Skin: Negative.   Neurological:  Negative for dizziness, weakness, light-headedness and headaches.  Hematological:  Negative for adenopathy. Does not bruise/bleed easily.  Psychiatric/Behavioral:  Negative for hallucinations.    Objective:  BP (!) 142/86 (BP Location: Left Arm, Patient Position: Sitting, Cuff Size: Large)    Pulse 87    Temp 98.1 F (36.7 C) (Oral)    Resp 16    Ht 5\' 10"  (1.778 m)    Wt 188 lb (85.3 kg)  SpO2 96%    BMI 26.98 kg/m   BP Readings from Last 3 Encounters:  08/25/21 (!) 142/86  06/28/21 124/82  05/30/21 132/86    Wt Readings from Last 3 Encounters:  08/25/21 188 lb (85.3 kg)  06/28/21 191 lb (86.6 kg)  05/30/21 189 lb (85.7 kg)    Physical Exam Vitals reviewed.  HENT:     Nose: Nose normal.     Mouth/Throat:     Mouth: Mucous membranes are moist.  Eyes:     General: No scleral icterus.    Conjunctiva/sclera: Conjunctivae normal.  Cardiovascular:     Rate and Rhythm: Normal rate and regular rhythm.     Heart sounds: Normal heart sounds, S1 normal and S2 normal. No murmur heard.   No friction rub.  No gallop.     Comments: EKG- NSR, 70 bpm +LVH, RBBB, LAFB - unchanged Pulmonary:     Breath sounds: No stridor. No wheezing, rhonchi or rales.  Abdominal:     General: Abdomen is flat.     Palpations: There is no mass.     Tenderness: There is no abdominal tenderness. There is no guarding.     Hernia: No hernia is present.  Musculoskeletal:     Cervical back: Neck supple. No tenderness.     Right lower leg: No edema.     Left lower leg: No edema.  Lymphadenopathy:     Cervical: No cervical adenopathy.  Skin:    General: Skin is warm and dry.     Coloration: Skin is not pale.  Neurological:     General: No focal deficit present.     Mental Status: He is alert.  Psychiatric:        Mood and Affect: Mood normal.    Lab Results  Component Value Date   WBC 6.1 08/25/2021   HGB 16.8 08/25/2021   HCT 50.0 08/25/2021   PLT 304.0 08/25/2021   GLUCOSE 92 08/25/2021   CHOL 173 03/23/2021   TRIG 185.0 (H) 03/23/2021   HDL 48.60 03/23/2021   LDLCALC 87 03/23/2021   ALT 35 03/23/2021   AST 32 03/23/2021   NA 140 08/25/2021   K 3.9 08/25/2021   CL 102 08/25/2021   CREATININE 1.15 08/25/2021   BUN 19 08/25/2021   CO2 29 08/25/2021   TSH 1.69 08/25/2021   PSA 0.50 03/23/2021   HGBA1C 5.3 10/30/2019    Patient was never admitted.  Assessment & Plan:   Jashawn was seen today for hypertension.  Diagnoses and all orders for this visit:  Non-seasonal allergic rhinitis due to other allergic trigger -     Ambulatory referral to Allergy  Primary hypertension- His blood pressure is not adequately well controlled.  Will add an ARB to the CCB. -     Aldosterone + renin activity w/ ratio; Future -     CBC with Differential/Platelet; Future -     Basic metabolic panel; Future -     EKG 12-Lead -     Basic metabolic panel -     CBC with Differential/Platelet -     Aldosterone + renin activity w/ ratio -     olmesartan (BENICAR) 20 MG tablet; Take 1 tablet (20 mg total) by mouth  daily.  Acquired hypothyroidism- His TSH is in the normal range.  Thyroid replacement therapy is not indicated. -     TSH; Future -     TSH  Drug-induced erectile dysfunction  Hypogonadism male- Will start testosterone  replacement therapy. -     Testosterone Total,Free,Bio, Males; Future -     Testosterone Total,Free,Bio, Males  Right BBB/left ant fasc block -     Ambulatory referral to Cardiology   I am having [name] start on olmesartan. I am also having him maintain his sertraline, allopurinol, busPIRone, tadalafil, Viberzi, pramoxine, amLODipine, clonazePAM, atorvastatin, and albuterol.  Meds ordered this encounter  Medications   olmesartan (BENICAR) 20 MG tablet    Sig: Take 1 tablet (20 mg total) by mouth daily.    Dispense:  90 tablet    Refill:  0     Follow-up: Return in about 6 months (around 02/22/2022).  Scarlette Calico, MD

## 2021-08-25 NOTE — Patient Instructions (Signed)

## 2021-08-31 ENCOUNTER — Other Ambulatory Visit: Payer: Self-pay | Admitting: Internal Medicine

## 2021-08-31 ENCOUNTER — Encounter: Payer: Self-pay | Admitting: Internal Medicine

## 2021-08-31 DIAGNOSIS — E039 Hypothyroidism, unspecified: Secondary | ICD-10-CM

## 2021-08-31 DIAGNOSIS — E291 Testicular hypofunction: Secondary | ICD-10-CM

## 2021-08-31 LAB — ALDOSTERONE + RENIN ACTIVITY W/ RATIO
ALDO / PRA Ratio: 3.7 Ratio (ref 0.9–28.9)
Aldosterone: 5 ng/dL
Renin Activity: 1.34 ng/mL/h (ref 0.25–5.82)

## 2021-08-31 LAB — TESTOSTERONE TOTAL,FREE,BIO, MALES
Albumin: 4.7 g/dL (ref 3.6–5.1)
Sex Hormone Binding: 24 nmol/L (ref 22–77)
Testosterone: 123 ng/dL — ABNORMAL LOW (ref 250–827)

## 2021-08-31 MED ORDER — TESTOSTERONE 50 MG/5GM (1%) TD GEL: 5.0000 g | Freq: Every day | TRANSDERMAL | 1 refills | Status: DC

## 2021-08-31 MED ORDER — OLMESARTAN MEDOXOMIL 20 MG PO TABS: 20.0000 mg | ORAL_TABLET | Freq: Every day | ORAL | 0 refills | Status: DC

## 2021-09-03 ENCOUNTER — Other Ambulatory Visit: Payer: Self-pay | Admitting: Internal Medicine

## 2021-09-05 ENCOUNTER — Other Ambulatory Visit: Payer: Self-pay | Admitting: Internal Medicine

## 2021-09-05 DIAGNOSIS — F411 Generalized anxiety disorder: Secondary | ICD-10-CM

## 2021-09-05 DIAGNOSIS — E291 Testicular hypofunction: Secondary | ICD-10-CM

## 2021-09-05 MED ORDER — XYOSTED 75 MG/0.5ML ~~LOC~~ SOAJ: 75.0000 mg | SUBCUTANEOUS | 1 refills | Status: DC

## 2021-09-06 ENCOUNTER — Other Ambulatory Visit: Payer: Self-pay | Admitting: Internal Medicine

## 2021-09-06 ENCOUNTER — Telehealth: Payer: Self-pay

## 2021-09-06 DIAGNOSIS — E291 Testicular hypofunction: Secondary | ICD-10-CM

## 2021-09-06 MED ORDER — XYOSTED 75 MG/0.5ML ~~LOC~~ SOAJ: 75.0000 mg | SUBCUTANEOUS | 1 refills | Status: DC

## 2021-09-06 NOTE — Telephone Encounter (Signed)
Please advise. Patient would like the prescription sent to [facility] on [address] . Thank you

## 2021-09-06 NOTE — Telephone Encounter (Signed)
Pharmacy is requesting the ok to approve the amount to 6ML instead of 5.88ML due to the medication coming in 2 ml increments for XYOSTED 75 MG/0.5ML SOAJ  [id number]

## 2021-09-12 ENCOUNTER — Ambulatory Visit (INDEPENDENT_AMBULATORY_CARE_PROVIDER_SITE_OTHER): Payer: BC Managed Care – PPO | Admitting: Cardiology

## 2021-09-12 ENCOUNTER — Other Ambulatory Visit: Payer: Self-pay

## 2021-09-12 ENCOUNTER — Encounter (HOSPITAL_BASED_OUTPATIENT_CLINIC_OR_DEPARTMENT_OTHER): Payer: Self-pay | Admitting: Cardiology

## 2021-09-12 VITALS — BP 110/78 | HR 88 | Ht 70.0 in | Wt 191.4 lb

## 2021-09-12 DIAGNOSIS — R9431 Abnormal electrocardiogram [ECG] [EKG]: Secondary | ICD-10-CM

## 2021-09-12 DIAGNOSIS — Z8249 Family history of ischemic heart disease and other diseases of the circulatory system: Secondary | ICD-10-CM | POA: Diagnosis not present

## 2021-09-12 DIAGNOSIS — Z7189 Other specified counseling: Secondary | ICD-10-CM

## 2021-09-12 DIAGNOSIS — E7800 Pure hypercholesterolemia, unspecified: Secondary | ICD-10-CM

## 2021-09-12 DIAGNOSIS — I10 Essential (primary) hypertension: Secondary | ICD-10-CM | POA: Diagnosis not present

## 2021-09-12 DIAGNOSIS — I4510 Unspecified right bundle-branch block: Secondary | ICD-10-CM | POA: Diagnosis not present

## 2021-09-12 NOTE — Patient Instructions (Signed)
Medication Instructions:  °Your Physician recommend you continue on your current medication as directed.   ° °*If you need a refill on your cardiac medications before your next appointment, please call your pharmacy* ° ° °Lab Work: °None ordered today ° ° °Testing/Procedures: °CT coronary calcium score.  ° °Test locations:  °[facility] ([address] [place], [place] [address]) °[facility] ([address] [place], [place] [address])  ° °This is $99 out of pocket. ° ° °Coronary CalciumScan °A coronary calcium scan is an imaging test used to look for deposits of calcium and other fatty materials (plaques) in the inner lining of the blood vessels of the heart (coronary arteries). These deposits of calcium and plaques can partly clog and narrow the coronary arteries without producing any symptoms or warning signs. This puts a person at risk for a heart attack. This test can detect these deposits before symptoms develop. °Tell a health care provider about: °Any allergies you have. °All medicines you are taking, including vitamins, herbs, eye drops, creams, and over-the-counter medicines. °Any problems you or family members have had with anesthetic medicines. °Any blood disorders you have. °Any surgeries you have had. °Any medical conditions you have. °Whether you are pregnant or may be pregnant. °What are the risks? °Generally, this is a safe procedure. However, problems may occur, including: °Harm to a pregnant woman and her unborn baby. This test involves the use of radiation. Radiation exposure can be dangerous to a pregnant woman and her unborn baby. If you are pregnant, you generally should not have this procedure done. °Slight increase in the risk of cancer. This is because of the radiation involved in the test. °What happens before the procedure? °No preparation is needed for this procedure. °What happens during the procedure? °You will undress and remove any jewelry around your neck or  chest. °You will put on a hospital gown. °Sticky electrodes will be placed on your chest. The electrodes will be connected to an electrocardiogram (ECG) machine to record a tracing of the electrical activity of your heart. °A CT scanner will take pictures of your heart. During this time, you will be asked to lie still and hold your breath for 2-3 seconds while a picture of your heart is being taken. °The procedure may vary among health care providers and hospitals. °What happens after the procedure? °You can get dressed. °You can return to your normal activities. °It is up to you to get the results of your test. Ask your health care provider, or the department that is doing the test, when your results will be ready. °Summary °A coronary calcium scan is an imaging test used to look for deposits of calcium and other fatty materials (plaques) in the inner lining of the blood vessels of the heart (coronary arteries). °Generally, this is a safe procedure. Tell your health care provider if you are pregnant or may be pregnant. °No preparation is needed for this procedure. °A CT scanner will take pictures of your heart. °You can return to your normal activities after the scan is done. °This information is not intended to replace advice given to you by your health care provider. Make sure you discuss any questions you have with your health care provider. °Document Released: 01/13/2008 Document Revised: 06/05/2016 Document Reviewed: 06/05/2016 °Elsevier Interactive Patient Education © 2017 [employer]  ° ° ° °Follow-Up: °At [facility], you and your health needs are our priority.  As part of our continuing mission to provide you with exceptional   heart care, we have created designated Provider Care Teams.  These Care Teams include your primary Cardiologist (physician) and Advanced Practice Providers (APPs -  Physician Assistants and Nurse Practitioners) who all work together to provide you with the care you need, when you  need it. ° °We recommend signing up for the patient portal called "MyChart".  Sign up information is provided on this After Visit Summary.  MyChart is used to connect with patients for Virtual Visits (Telemedicine).  Patients are able to view lab/test results, encounter notes, upcoming appointments, etc.  Non-urgent messages can be sent to your provider as well.   °To learn more about what you can do with MyChart, go to [email or web address].   ° °Your next appointment:   °As needed ° °The format for your next appointment:   °In Person ° °Provider:   °Bridgette Christopher, MD  ° ° °

## 2021-09-12 NOTE — Progress Notes (Signed)
Cardiology Office Note:    Date:  09/12/2021   ID:  Michael Miller, DOB 04-10-1966, MRN [id number]  PCP:  [name], MD  Cardiologist:  Buford Dresser, MD  Referring MD: [name], MD   CC: new patient consultation for abnormal ECG  History of Present Illness:    Chritopher Miller is a 56 y.o. 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  Notes from 08/25/21 visit with Dr. Ronnald Ramp reviewed. Mr. Brosseau noted a history of RBBB and LAFB that was previously evaluated by a cardiologist in [place] years ago. EKG at his visit with Dr. Ronnald Ramp showed NSR, 70 bpm, +LVH, RBBB, and LAFB unchanged from prior. His blood pressure was 142/86, so 20 mg olmesartan was started. He requested a referral to cardiology to be sure that his heart is healthy.  Cardiovascular risk factors: Prior clinical ASCVD: None. Comorbid conditions: Hypertension - 10 years. More well controlled in clinic today, 110/78. Hyperlipidemia - 6/7 years. Always borderline, on medication for prevention. Metabolic syndrome/Obesity: Highest adult weight about 195 lbs. Usually 185-186 lbs at home. Chronic inflammatory conditions: None Tobacco use history: Former smoker. Smoked for about 20 years, quit 17 years ago. Alcohol use: Socially, a couple cocktails or wine in a given night. Family history: His father had aortic valve replacement around 57 yo, CABG x3. His brother had a stent placement at 31 yo.  Prior cardiac testing and/or incidental findings on other testing (ie coronary calcium): RBBB initially noted on EKG 7-8 years ago in [place]. At the time he was possibly going to donate a kidney. No other incidental findings. Exercise level: Works our 6 days a week, including cardio, weight training, and other exercise classes. He denies any physical limitations from a cardiovascular  standpoint. Current diet: Tries to follow a balanced diet. Generally healthy.  Previously he would become very dizzy during positional movements during yoga. This occurred for about a week in the past month before resolving spontaneously.  He endorses remote episodes of racing heart beats. This has not occurred for a couple years.  Rarely, he may wake up in the night with PND. He also endorses snoring, and wears a CPAP.  He denies any chest pain, shortness of breath, or peripheral edema. No headaches, syncope, or orthopnea.  Past Medical History:  Diagnosis Date   Depression    Hypercholesteremia    Hypertension    Hypothyroid     Past Surgical History:  Procedure Laterality Date   TONSILLECTOMY  1971    Current Medications: Current Outpatient Medications on File Prior to Visit  Medication Sig   allopurinol (ZYLOPRIM) 300 MG tablet Take 1 tablet (300 mg total) by mouth daily.   atorvastatin (LIPITOR) 20 MG tablet Take 1 tablet (20 mg total) by mouth daily.   busPIRone (BUSPAR) 10 MG tablet TAKE 1 TABLET BY MOUTH TWICE A DAY   clonazePAM (KLONOPIN) 0.5 MG tablet TAKE 1 TABLET BY MOUTH EVERY DAY AS NEEDED FOR ANXIETY   Eluxadoline (VIBERZI) 75 MG TABS Take 75 mg by mouth 2 (two) times daily with a meal.   fluticasone (FLONASE) 50 MCG/ACT nasal spray Place 1 spray into both nostrils as needed.   olmesartan (BENICAR) 20 MG tablet Take 1 tablet (20 mg total) by mouth daily.   sertraline (ZOLOFT) 50 MG tablet Take 50 mg by mouth daily.  tadalafil (CIALIS) 20 MG tablet Take 1 tablet (20 mg total) by mouth every other day as needed for erectile dysfunction.   XYOSTED 75 MG/0.5ML SOAJ Inject 75 mg into the skin once a week. (Patient not taking: Reported on 09/12/2021)   No current facility-administered medications on file prior to visit.     Allergies:   Patient has no known allergies.   Social History   Tobacco Use   Smoking status: Former   Smokeless tobacco: Never  Substance  Use Topics   Alcohol use: Yes    Alcohol/week: 7.0 standard drinks    Types: 7 Glasses of wine per week   Drug use: Never    Family History: family history includes CAD in his brother and father; Hypertension in his brother.  ROS:   Please see the history of present illness.  Additional pertinent ROS: Constitutional: Negative for chills, fever, night sweats, unintentional weight loss  HENT: Negative for ear pain and hearing loss.   Eyes: Negative for loss of vision and eye pain.  Respiratory: Negative for cough, sputum, wheezing. Positive for snoring, PND (rare).  Cardiovascular: See HPI. Gastrointestinal: Negative for abdominal pain, melena, and hematochezia.  Genitourinary: Negative for dysuria and hematuria.  Musculoskeletal: Negative for falls and myalgias.  Skin: Negative for itching and rash.  Neurological: Negative for focal weakness, focal sensory changes and loss of consciousness.  Endo/Heme/Allergies: Does not bruise/bleed easily.     EKGs/Labs/Other Studies Reviewed:    The following studies were reviewed today:  No prior cardiovascular studies available.  EKG:  EKG is personally reviewed.   09/12/2021: NSR at 88 bpm, RBBB, LAFB  Recent Labs: 03/23/2021: ALT 35 08/25/2021: BUN 19; Creatinine, Ser 1.15; Hemoglobin 16.8; Platelets 304.0; Potassium 3.9; Sodium 140; TSH 1.69   Recent Lipid Panel    Component Value Date/Time   CHOL 173 03/23/2021 1044   TRIG 185.0 (H) 03/23/2021 1044   HDL 48.60 03/23/2021 1044   CHOLHDL 4 03/23/2021 1044   VLDL 37.0 03/23/2021 1044   LDLCALC 87 03/23/2021 1044    Physical Exam:    VS:  BP 110/78 (BP Location: Left Arm, Patient Position: Sitting, Cuff Size: Normal)    Pulse 88    Ht 5\' 10"  (1.778 m)    Wt 191 lb 6.4 oz (86.8 kg)    BMI 27.46 kg/m     Wt Readings from Last 3 Encounters:  09/12/21 191 lb 6.4 oz (86.8 kg)  08/25/21 188 lb (85.3 kg)  06/28/21 191 lb (86.6 kg)    GEN: Well nourished, well developed in no acute  distress HEENT: Normal, moist mucous membranes NECK: No JVD CARDIAC: regular rhythm, normal S1 and S2, no rubs or gallops. No murmur. VASCULAR: Radial and DP pulses 2+ bilaterally. No carotid bruits RESPIRATORY:  Clear to auscultation without rales, wheezing or rhonchi  ABDOMEN: Soft, non-tender, non-distended MUSCULOSKELETAL:  Ambulates independently SKIN: Warm and dry, no edema NEUROLOGIC:  Alert and oriented x 3. No focal neuro deficits noted. PSYCHIATRIC:  Normal affect    ASSESSMENT:    1. Abnormal ECG   2. Family history of early CAD   3. RBBB   4. Essential hypertension   5. Cardiac risk counseling   6. Counseling on health promotion and disease prevention   7. Pure hypercholesterolemia    PLAN:    Abnormal ECG RBBB LAFB -stable from ECG at Dr. Ronnald Ramp office, first told about this years ago -reviewed electrical system of the heart -no high risk features -counseled on  red flag warning signs that need immediate medical attention  Family history of heart disease Hypercholesterolemia -on atorvastatin 20 mg daily, last LDL 87 -we discussed calcium score today--I discussed that this is used to decide whether or not to begin a statin, but he would like a measure of whether he has plaque. We did discuss that this does not show fatty plaque but does show calcium. Also discussed that this is an out of pocket test. He wishes to proceed with calcium score  Hypertension: -at goal today -continue olmesartan  Cardiac risk counseling and prevention recommendations: -recommend heart healthy/Mediterranean diet, with whole grains, fruits, vegetable, fish, lean meats, nuts, and olive oil. Limit salt. -recommend moderate walking, 3-5 times/week for 30-50 minutes each session. Aim for at least 150 minutes.week. Goal should be pace of 3 miles/hours, or walking 1.5 miles in 30 minutes -recommend avoidance of tobacco products. Avoid excess alcohol. -ASCVD risk score: The 10-year ASCVD risk  score (Arnett DK, et al., 2019) is: 8.7%   Values used to calculate the score:     Age: 9 years     Sex: Male     Is Non-Hispanic African American: No     Diabetic: No     Tobacco smoker: Yes     Systolic Blood Pressure: 440 mmHg     Is BP treated: Yes     HDL Cholesterol: 48.6 mg/dL     Total Cholesterol: 173 mg/dL    Plan for follow up: PRN.   Buford Dresser, MD, PhD, [facility]    Medication Adjustments/Labs and Tests Ordered: Current medicines are reviewed at length with the patient today.  Concerns regarding medicines are outlined above.   Orders Placed This Encounter  Procedures   CT CARDIAC SCORING (SELF PAY ONLY)   EKG 12-Lead   No orders of the defined types were placed in this encounter.  Patient Instructions  Medication Instructions:  Your Physician recommend you continue on your current medication as directed.    *If you need a refill on your cardiac medications before your next appointment, please call your pharmacy*   Lab Work: None ordered today   Testing/Procedures: CT coronary calcium score.   Test locations:  [facility] ([address] [place], [place] [address]) [facility] ([address] [place], [place] [address])   This is $99 out of pocket.   Coronary CalciumScan A coronary calcium scan is an imaging test used to look for deposits of calcium and other fatty materials (plaques) in the inner lining of the blood vessels of the heart (coronary arteries). These deposits of calcium and plaques can partly clog and narrow the coronary arteries without producing any symptoms or warning signs. This puts a person at risk for a heart attack. This test can detect these deposits before symptoms develop. Tell a health care provider about: Any allergies you have. All medicines you are taking, including vitamins, herbs, eye drops, creams, and over-the-counter medicines. Any problems you or family members  have had with anesthetic medicines. Any blood disorders you have. Any surgeries you have had. Any medical conditions you have. Whether you are pregnant or may be pregnant. What are the risks? Generally, this is a safe procedure. However, problems may occur, including: Harm to a pregnant woman and her unborn baby. This test involves the use of radiation. Radiation exposure can be dangerous to a pregnant woman and her unborn baby. If you are pregnant, you generally should not have this procedure  done. Slight increase in the risk of cancer. This is because of the radiation involved in the test. What happens before the procedure? No preparation is needed for this procedure. What happens during the procedure? You will undress and remove any jewelry around your neck or chest. You will put on a hospital gown. Sticky electrodes will be placed on your chest. The electrodes will be connected to an electrocardiogram (ECG) machine to record a tracing of the electrical activity of your heart. A CT scanner will take pictures of your heart. During this time, you will be asked to lie still and hold your breath for 2-3 seconds while a picture of your heart is being taken. The procedure may vary among health care providers and hospitals. What happens after the procedure? You can get dressed. You can return to your normal activities. It is up to you to get the results of your test. Ask your health care provider, or the department that is doing the test, when your results will be ready. Summary A coronary calcium scan is an imaging test used to look for deposits of calcium and other fatty materials (plaques) in the inner lining of the blood vessels of the heart (coronary arteries). Generally, this is a safe procedure. Tell your health care provider if you are pregnant or may be pregnant. No preparation is needed for this procedure. A CT scanner will take pictures of your heart. You can return to your normal  activities after the scan is done. This information is not intended to replace advice given to you by your health care provider. Make sure you discuss any questions you have with your health care provider. Document Released: 01/13/2008 Document Revised: 06/05/2016 Document Reviewed: 06/05/2016 Elsevier Interactive Patient Education  2017 [place]: At [facility], you and your health needs are our priority.  As part of our continuing mission to provide you with exceptional heart care, we have created designated Provider Care Teams.  These Care Teams include your primary Cardiologist (physician) and Advanced Practice Providers (APPs -  Physician Assistants and Nurse Practitioners) who all work together to provide you with the care you need, when you need it.  We recommend signing up for the patient portal called "MyChart".  Sign up information is provided on this After Visit Summary.  MyChart is used to connect with patients for Virtual Visits (Telemedicine).  Patients are able to view lab/test results, encounter notes, upcoming appointments, etc.  Non-urgent messages can be sent to your provider as well.   To learn more about what you can do with MyChart, go to [email or web address].    Your next appointment:   As needed  The format for your next appointment:   In Person  Provider:   Buford Dresser, MD       [facility],acting as a scribe for Buford Dresser, MD.,have documented all relevant documentation on the behalf of Buford Dresser, MD,as directed by  Buford Dresser, MD while in the presence of Buford Dresser, MD.  I, Buford Dresser, MD, have reviewed all documentation for this visit. The documentation on 09/12/21 for the exam, diagnosis, procedures, and orders are all accurate and complete.   Signed, Buford Dresser, MD PhD 09/12/2021 6:53 PM    [place]

## 2021-09-13 DIAGNOSIS — M25641 Stiffness of right hand, not elsewhere classified: Secondary | ICD-10-CM | POA: Diagnosis not present

## 2021-09-13 DIAGNOSIS — M13841 Other specified arthritis, right hand: Secondary | ICD-10-CM | POA: Diagnosis not present

## 2021-09-22 ENCOUNTER — Other Ambulatory Visit: Payer: Self-pay | Admitting: Internal Medicine

## 2021-09-22 DIAGNOSIS — F411 Generalized anxiety disorder: Secondary | ICD-10-CM

## 2021-09-22 DIAGNOSIS — M25641 Stiffness of right hand, not elsewhere classified: Secondary | ICD-10-CM | POA: Diagnosis not present

## 2021-09-27 ENCOUNTER — Other Ambulatory Visit: Payer: Self-pay | Admitting: Internal Medicine

## 2021-09-27 DIAGNOSIS — M1A09X0 Idiopathic chronic gout, multiple sites, without tophus (tophi): Secondary | ICD-10-CM

## 2021-09-29 DIAGNOSIS — M25641 Stiffness of right hand, not elsewhere classified: Secondary | ICD-10-CM | POA: Diagnosis not present

## 2021-10-06 DIAGNOSIS — M25641 Stiffness of right hand, not elsewhere classified: Secondary | ICD-10-CM | POA: Diagnosis not present

## 2021-10-11 ENCOUNTER — Other Ambulatory Visit: Payer: Self-pay

## 2021-10-11 ENCOUNTER — Ambulatory Visit (INDEPENDENT_AMBULATORY_CARE_PROVIDER_SITE_OTHER): Payer: BC Managed Care – PPO | Admitting: Allergy and Immunology

## 2021-10-11 ENCOUNTER — Encounter: Payer: Self-pay | Admitting: Allergy and Immunology

## 2021-10-11 VITALS — BP 92/62 | HR 114 | Temp 98.4°F | Resp 16 | Ht 70.0 in | Wt 190.6 lb

## 2021-10-11 DIAGNOSIS — J301 Allergic rhinitis due to pollen: Secondary | ICD-10-CM

## 2021-10-11 DIAGNOSIS — H1010 Acute atopic conjunctivitis, unspecified eye: Secondary | ICD-10-CM

## 2021-10-11 DIAGNOSIS — H1013 Acute atopic conjunctivitis, bilateral: Secondary | ICD-10-CM

## 2021-10-11 DIAGNOSIS — M25641 Stiffness of right hand, not elsewhere classified: Secondary | ICD-10-CM | POA: Diagnosis not present

## 2021-10-11 DIAGNOSIS — M13841 Other specified arthritis, right hand: Secondary | ICD-10-CM | POA: Diagnosis not present

## 2021-10-11 MED ORDER — LORATADINE 10 MG PO TABS: 10.0000 mg | ORAL_TABLET | Freq: Two times a day (BID) | ORAL | 11 refills | Status: DC | PRN

## 2021-10-11 MED ORDER — RYALTRIS 665-25 MCG/ACT NA SUSP: 2.0000 | Freq: Two times a day (BID) | NASAL | 11 refills | Status: DC

## 2021-10-11 MED ORDER — OLOPATADINE HCL 0.2 % OP SOLN: 1.0000 [drp] | OPHTHALMIC | 11 refills | Status: DC

## 2021-10-11 MED ORDER — SYSTANE COMPLETE 0.6 % OP SOLN: 1.0000 [drp] | Freq: Four times a day (QID) | OPHTHALMIC | 11 refills | Status: DC | PRN

## 2021-10-11 NOTE — Progress Notes (Signed)
?[place] ? ? ?Dear Dr. Ronnald Ramp, ? ?Thank you for referring Billie Intriago to the [facility] on 10/11/2021.  ? ?Below is a summation of this patient's evaluation and recommendations. ? ?Thank you for your referral. I will keep you informed about this patient's response to treatment.  ? ?If you have any questions please do not hesitate to contact me.  ? ?Sincerely, ? ?Jiles Prows, MD ?Allergy / Immunology ?[facility] ? ? ?______________________________________________________________________ ? ? ? ?NEW PATIENT NOTE ? ?Referring Provider: [name], MD ?Primary Provider: [name], MD ?Date of office visit: 10/11/2021 ?   ?Subjective:  ? ?Chief Complaint:  Michael Miller (DOB: 1966/01/01) is a 56 y.o. male who presents to the clinic on 10/11/2021 with a chief complaint of Allergy Testing (Wants to know what he is allergic to. Says he has issues when pollen is high. Never had any reaction to foods. ) ?.    ? ?HPI: [place] presents to this clinic in evaluation of seasonal allergies. ? ?He has a 15-year history of developing problems with nasal congestion and sneezing and irritated itchy throat and itchy red eyes that occurs both during the spring and fall especially following exposure to the outdoors for which he will use Flonase which helps this issue somewhat.  But even in the face of therapy he remains symptomatic.  During the summer and in the winter he does not have any problems. ? ?There is not a history of having other atopic disease involving other areas of his respiratory tract or his skin or food hypersensitivity. ? ?Past Medical History:  ?Diagnosis Date  ? Depression   ? Hypercholesteremia   ? Hypertension   ? Hypothyroid   ? ? ?Past Surgical History:  ?Procedure Laterality Date  ? ADENOIDECTOMY  1971  ? ARTHRODESIS N/A 08/08/2021  ? Thumb Bone Fusion  ? HERNIA REPAIR  N/A 2016  ? Hernioplasty  ? KNEE ARTHROSCOPY Left 2012  ? SHOULDER ARTHROSCOPY Bilateral 2014  ? Left and Right  ? TONSILLECTOMY  1971  ? ? ?Allergies as of 10/11/2021   ?No Known Allergies ?  ? ?  ?Medication List  ? ? ?allopurinol 300 MG tablet ?Commonly known as: ZYLOPRIM ?TAKE 1 TABLET BY MOUTH EVERY DAY ?  ?atorvastatin 20 MG tablet ?Commonly known as: LIPITOR ?Take 1 tablet (20 mg total) by mouth daily. ?  ?busPIRone 10 MG tablet ?Commonly known as: BUSPAR ?TAKE 1 TABLET BY MOUTH TWICE A DAY ?  ?clonazePAM 0.5 MG tablet ?Commonly known as: KLONOPIN ?TAKE 1 TABLET BY MOUTH EVERY DAY AS NEEDED FOR ANXIETY ?  ?fluticasone 50 MCG/ACT nasal spray ?Commonly known as: FLONASE ?Place 1 spray into both nostrils as needed. ?  ?olmesartan 20 MG tablet ?Commonly known as: BENICAR ?Take 1 tablet (20 mg total) by mouth daily. ?  ?sertraline 50 MG tablet ?Commonly known as: ZOLOFT ?Take 50 mg by mouth daily. ?  ?testosterone 50 MG/5GM (1%) Gel ?Commonly known as: ANDROGEL ?testosterone 50 mg/5 gram (1 %) transdermal gel ? PLACE 5 G ONTO THE SKIN DAILY. ?  ?Viberzi 75 MG Tabs ?Generic drug: Eluxadoline ?Take 75 mg by mouth 2 (two) times daily with a meal. ?  ? ?Review of systems negative except as noted in HPI / PMHx or noted below: ? ?Review of Systems  ?Constitutional: Negative.   ?HENT: Negative.    ?  Eyes: Negative.   ?Respiratory: Negative.    ?Cardiovascular: Negative.   ?Gastrointestinal: Negative.   ?Genitourinary: Negative.   ?Musculoskeletal: Negative.   ?Skin: Negative.   ?Neurological: Negative.   ?Endo/Heme/Allergies: Negative.   ?Psychiatric/Behavioral: Negative.    ? ?Family History  ?Problem Relation Age of Onset  ? CAD Father   ? CAD Brother   ? Hypertension Brother   ? ? ?Social History  ? ?Socioeconomic History  ? Marital status: Married  ?  Spouse name: Not on file  ? Number of children: Not on file  ? Years of education: Not on file  ? Highest education level: Not on file  ?Occupational History  ? Not on  file  ?Tobacco Use  ? Smoking status: Former  ? Smokeless tobacco: Never  ?Vaping Use  ? Vaping Use: Not on file  ?Substance and Sexual Activity  ? Alcohol use: Yes  ?  Alcohol/week: 7.0 standard drinks  ?  Types: 7 Glasses of wine per week  ? Drug use: Never  ? Sexual activity: Yes  ?  Partners: Male  ?Other Topics Concern  ? Not on file  ?Social History Narrative  ? Not on file  ? ?Social Determinants of Health  ? ?Financial Resource Strain: Low Risk   ? Difficulty of Paying Living Expenses: Not hard at all  ?Food Insecurity: No Food Insecurity  ? Worried About Charity fundraiser in the Last Year: Never true  ? Ran Out of Food in the Last Year: Never true  ?Transportation Needs: No Transportation Needs  ? Lack of Transportation (Medical): No  ? Lack of Transportation (Non-Medical): No  ?Physical Activity: Sufficiently Active  ? Days of Exercise per Week: 5 days  ? Minutes of Exercise per Session: 60 min  ?Stress: Not on file  ?Social Connections: Not on file  ?Intimate Partner Violence: Not on file  ? ? ?Environmental and Social history ? ?Lives in a house with a dry environment, a dog located inside the household, no carpet in the bedroom, no plastic on the bed, no plastic on the pillow, and no smoking ongoing with inside the household.  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 ? ?Objective:  ? ?Vitals:  ? 10/11/21 1021  ?BP: 92/62  ?Pulse: (!) 114  ?Resp: 16  ?Temp: 98.4 ?F (36.9 ?C)  ?SpO2: 96%  ? ?Height: '5\' 10"'$  (177.8 cm) ?Weight: 190 lb 9.6 oz (86.5 kg) ? ?Physical Exam ?Constitutional:   ?   Appearance: He is not diaphoretic.  ?HENT:  ?   Head: Normocephalic.  ?   Right Ear: Tympanic membrane, ear canal and external ear normal.  ?   Left Ear: Tympanic membrane, ear canal and external ear normal.  ?   Nose: Nose normal. No mucosal edema or rhinorrhea.  ?   Mouth/Throat:  ?   Pharynx: Uvula midline. No oropharyngeal exudate.  ?Eyes:  ?   Conjunctiva/sclera: Conjunctivae normal.  ?Neck:  ?   Thyroid: No  thyromegaly.  ?   Trachea: Trachea normal. No tracheal tenderness or tracheal deviation.  ?Cardiovascular:  ?   Rate and Rhythm: Normal rate and regular rhythm.  ?   Heart sounds: Normal heart sounds, S1 normal and S2 normal. No murmur heard. ?Pulmonary:  ?   Effort: No respiratory distress.  ?   Breath sounds: Normal breath sounds. No stridor. No wheezing or rales.  ?Lymphadenopathy:  ?   Head:  ?   Right side of head: No tonsillar adenopathy.  ?  Left side of head: No tonsillar adenopathy.  ?   Cervical: No cervical adenopathy.  ?Skin: ?   Findings: No erythema or rash.  ?   Nails: There is no clubbing.  ?Neurological:  ?   Mental Status: He is alert.  ? ? ?Diagnostics: Allergy skin tests were performed.  He demonstrated hypersensitivity to trees, grasses, and weeds. ? ?Assessment and Plan:  ? ? ?1. Seasonal allergic rhinitis due to pollen   ?2. Seasonal allergic conjunctivitis   ? ? ?1.  Allergen avoidance measures - pollen, sports glasses ? ?2. Ryaltris - 2 sprays each nostril 1-2 times per day (specialty pharmacy) ? ?3. If needed:  ? ?A. Loratadine / cetirizine 10 mg - 1 tablet 1 time per day ?B. Pataday - 1 drop each eye 1 time per day ?C. Systane -drops - multiple times per day ? ?4. Immunotherapy if fail medical therapy ? ?5. Return to clinic in 1 year or earlier if problem ? ?Josealfredo has atopic disease giving rise to inflammation of his upper airway and eyes especially during pollination season and he will do his best regarding allergen avoidance measures and use a combination nasal spray on a pretty consistent basis and if he fails medical therapy he would definitely be a candidate for immunotherapy.  We have given him literature on this form of therapy during today's visit.  I will see him back in this clinic in 1 year or earlier if there is a problem. ? ?Jiles Prows, MD ?Allergy / Immunology ?[facility] [place] ? ?

## 2021-10-11 NOTE — Patient Instructions (Addendum)
?  1.  Allergen avoidance measures - pollen, sports glasses ? ?2. Ryaltris - 2 sprays each nostril 1-2 times per day (specialty pharmacy) ? ?3. If needed:  ? ?A. Loratadine / cetirizine 10 mg - 1 tablet 1 time per day ?B. Pataday - 1 drop each eye 1 time per day ?C. Systane -drops - multiple times per day ? ?4. Immunotherapy if fail medical therapy ? ?5. Return to clinic in 1 year or earlier if problem ?

## 2021-10-13 DIAGNOSIS — R0681 Apnea, not elsewhere classified: Secondary | ICD-10-CM | POA: Diagnosis not present

## 2021-10-14 DIAGNOSIS — R0681 Apnea, not elsewhere classified: Secondary | ICD-10-CM | POA: Diagnosis not present

## 2021-10-15 ENCOUNTER — Other Ambulatory Visit: Payer: Self-pay | Admitting: Internal Medicine

## 2021-10-15 DIAGNOSIS — J4531 Mild persistent asthma with (acute) exacerbation: Secondary | ICD-10-CM

## 2021-10-17 DIAGNOSIS — M25641 Stiffness of right hand, not elsewhere classified: Secondary | ICD-10-CM | POA: Diagnosis not present

## 2021-10-20 DIAGNOSIS — G4733 Obstructive sleep apnea (adult) (pediatric): Secondary | ICD-10-CM | POA: Diagnosis not present

## 2021-10-24 DIAGNOSIS — M25641 Stiffness of right hand, not elsewhere classified: Secondary | ICD-10-CM | POA: Diagnosis not present

## 2021-10-31 ENCOUNTER — Other Ambulatory Visit: Payer: Self-pay | Admitting: Internal Medicine

## 2021-10-31 ENCOUNTER — Encounter: Payer: Self-pay | Admitting: Internal Medicine

## 2021-10-31 DIAGNOSIS — M25641 Stiffness of right hand, not elsewhere classified: Secondary | ICD-10-CM | POA: Diagnosis not present

## 2021-10-31 DIAGNOSIS — E291 Testicular hypofunction: Secondary | ICD-10-CM

## 2021-10-31 MED ORDER — XYOSTED 75 MG/0.5ML ~~LOC~~ SOAJ: 75.0000 mg | SUBCUTANEOUS | 1 refills | Status: DC

## 2021-11-01 ENCOUNTER — Inpatient Hospital Stay: Admission: RE | Admit: 2021-11-01 | Payer: BC Managed Care – PPO | Source: Ambulatory Visit

## 2021-11-01 ENCOUNTER — Ambulatory Visit (INDEPENDENT_AMBULATORY_CARE_PROVIDER_SITE_OTHER)
Admission: RE | Admit: 2021-11-01 | Discharge: 2021-11-01 | Disposition: A | Discharge status: Home / Self Care | Payer: Self-pay | Source: Ambulatory Visit | Attending: Cardiology | Admitting: Cardiology

## 2021-11-01 DIAGNOSIS — Z8249 Family history of ischemic heart disease and other diseases of the circulatory system: Secondary | ICD-10-CM

## 2021-11-05 ENCOUNTER — Encounter: Payer: Self-pay | Admitting: Internal Medicine

## 2021-11-07 ENCOUNTER — Other Ambulatory Visit: Payer: Self-pay | Admitting: Internal Medicine

## 2021-11-07 DIAGNOSIS — M25641 Stiffness of right hand, not elsewhere classified: Secondary | ICD-10-CM | POA: Diagnosis not present

## 2021-11-07 DIAGNOSIS — F411 Generalized anxiety disorder: Secondary | ICD-10-CM

## 2021-11-07 DIAGNOSIS — G4733 Obstructive sleep apnea (adult) (pediatric): Secondary | ICD-10-CM | POA: Diagnosis not present

## 2021-11-07 MED ORDER — SERTRALINE HCL 50 MG PO TABS: 50.0000 mg | ORAL_TABLET | Freq: Every day | ORAL | 1 refills | Status: DC

## 2021-11-08 ENCOUNTER — Telehealth: Payer: Self-pay

## 2021-11-08 ENCOUNTER — Other Ambulatory Visit: Payer: Self-pay | Admitting: Internal Medicine

## 2021-11-08 DIAGNOSIS — F411 Generalized anxiety disorder: Secondary | ICD-10-CM

## 2021-11-08 MED ORDER — SERTRALINE HCL 50 MG PO TABS
50.0000 mg | ORAL_TABLET | Freq: Every day | ORAL | 2 refills | Status: DC
Start: 2021-11-08 — End: 2022-03-03

## 2021-11-08 NOTE — Telephone Encounter (Signed)
Key: [id number] ?

## 2021-11-09 DIAGNOSIS — M13841 Other specified arthritis, right hand: Secondary | ICD-10-CM | POA: Diagnosis not present

## 2021-11-13 ENCOUNTER — Encounter: Payer: Self-pay | Admitting: Internal Medicine

## 2021-11-14 ENCOUNTER — Encounter (HOSPITAL_BASED_OUTPATIENT_CLINIC_OR_DEPARTMENT_OTHER): Payer: Self-pay

## 2021-11-14 ENCOUNTER — Other Ambulatory Visit: Payer: Self-pay | Admitting: Internal Medicine

## 2021-11-14 DIAGNOSIS — M1A09X0 Idiopathic chronic gout, multiple sites, without tophus (tophi): Secondary | ICD-10-CM

## 2021-11-14 DIAGNOSIS — I10 Essential (primary) hypertension: Secondary | ICD-10-CM

## 2021-11-14 DIAGNOSIS — E785 Hyperlipidemia, unspecified: Secondary | ICD-10-CM

## 2021-11-14 DIAGNOSIS — F411 Generalized anxiety disorder: Secondary | ICD-10-CM

## 2021-11-14 MED ORDER — ATORVASTATIN CALCIUM 20 MG PO TABS: 20.0000 mg | ORAL_TABLET | Freq: Every day | ORAL | 1 refills | Status: DC

## 2021-11-14 MED ORDER — OLMESARTAN MEDOXOMIL 20 MG PO TABS: 20.0000 mg | ORAL_TABLET | Freq: Every day | ORAL | 1 refills | Status: DC

## 2021-11-14 MED ORDER — ALLOPURINOL 300 MG PO TABS
300.0000 mg | ORAL_TABLET | Freq: Every day | ORAL | 1 refills | Status: DC
Start: 2021-11-14 — End: 2022-05-09

## 2021-11-14 MED ORDER — BUSPIRONE HCL 10 MG PO TABS: 10.0000 mg | ORAL_TABLET | Freq: Two times a day (BID) | ORAL | 1 refills | Status: DC

## 2021-11-18 ENCOUNTER — Encounter: Payer: Self-pay | Admitting: Internal Medicine

## 2021-11-22 NOTE — Telephone Encounter (Signed)
Questionnaire packet requesting additional information received today, completed, and faxed back. ?

## 2021-11-25 NOTE — Telephone Encounter (Signed)
Per CoverMyMeds: ? ?Approved ?10/09/2021-11/22/2022 ?

## 2021-11-28 ENCOUNTER — Other Ambulatory Visit: Payer: Self-pay | Admitting: Internal Medicine

## 2021-11-28 DIAGNOSIS — I10 Essential (primary) hypertension: Secondary | ICD-10-CM

## 2021-12-07 DIAGNOSIS — G4733 Obstructive sleep apnea (adult) (pediatric): Secondary | ICD-10-CM | POA: Diagnosis not present

## 2021-12-08 DIAGNOSIS — G4733 Obstructive sleep apnea (adult) (pediatric): Secondary | ICD-10-CM | POA: Diagnosis not present

## 2021-12-16 ENCOUNTER — Other Ambulatory Visit: Payer: Self-pay | Admitting: Internal Medicine

## 2021-12-16 DIAGNOSIS — F411 Generalized anxiety disorder: Secondary | ICD-10-CM

## 2021-12-19 ENCOUNTER — Other Ambulatory Visit: Payer: Self-pay | Admitting: Internal Medicine

## 2021-12-19 ENCOUNTER — Encounter: Payer: Self-pay | Admitting: Internal Medicine

## 2021-12-19 DIAGNOSIS — E291 Testicular hypofunction: Secondary | ICD-10-CM

## 2021-12-19 MED ORDER — XYOSTED 75 MG/0.5ML ~~LOC~~ SOAJ
75.0000 mg | SUBCUTANEOUS | 1 refills | Status: DC
Start: 2021-12-19 — End: 2022-05-30

## 2022-01-15 ENCOUNTER — Other Ambulatory Visit: Payer: Self-pay | Admitting: Internal Medicine

## 2022-01-15 DIAGNOSIS — I10 Essential (primary) hypertension: Secondary | ICD-10-CM

## 2022-03-01 ENCOUNTER — Encounter: Payer: Self-pay | Admitting: Internal Medicine

## 2022-03-03 ENCOUNTER — Other Ambulatory Visit: Payer: Self-pay | Admitting: Internal Medicine

## 2022-03-03 DIAGNOSIS — F411 Generalized anxiety disorder: Secondary | ICD-10-CM

## 2022-03-07 ENCOUNTER — Ambulatory Visit (INDEPENDENT_AMBULATORY_CARE_PROVIDER_SITE_OTHER): Payer: BC Managed Care – PPO | Admitting: Internal Medicine

## 2022-03-07 ENCOUNTER — Encounter: Payer: Self-pay | Admitting: Internal Medicine

## 2022-03-07 VITALS — BP 124/86 | HR 67 | Temp 98.3°F | Ht 70.0 in | Wt 192.2 lb

## 2022-03-07 DIAGNOSIS — I10 Essential (primary) hypertension: Secondary | ICD-10-CM | POA: Diagnosis not present

## 2022-03-07 DIAGNOSIS — E291 Testicular hypofunction: Secondary | ICD-10-CM

## 2022-03-07 DIAGNOSIS — N453 Epididymo-orchitis: Secondary | ICD-10-CM | POA: Diagnosis not present

## 2022-03-07 DIAGNOSIS — E039 Hypothyroidism, unspecified: Secondary | ICD-10-CM | POA: Diagnosis not present

## 2022-03-07 LAB — CBC WITH DIFFERENTIAL/PLATELET
Basophils Absolute: 0 10*3/uL (ref 0.0–0.1)
Basophils Relative: 0.5 % (ref 0.0–3.0)
Eosinophils Absolute: 0.1 10*3/uL (ref 0.0–0.7)
Eosinophils Relative: 1.3 % (ref 0.0–5.0)
HCT: 48.7 % (ref 39.0–52.0)
Hemoglobin: 16.7 g/dL (ref 13.0–17.0)
Lymphocytes Relative: 29.4 % (ref 12.0–46.0)
Lymphs Abs: 2.4 10*3/uL (ref 0.7–4.0)
MCHC: 34.3 g/dL (ref 30.0–36.0)
MCV: 90.9 fl (ref 78.0–100.0)
Monocytes Absolute: 0.9 10*3/uL (ref 0.1–1.0)
Monocytes Relative: 10.3 % (ref 3.0–12.0)
Neutro Abs: 4.8 10*3/uL (ref 1.4–7.7)
Neutrophils Relative %: 58.5 % (ref 43.0–77.0)
Platelets: 251 10*3/uL (ref 150.0–400.0)
RBC: 5.35 Mil/uL (ref 4.22–5.81)
RDW: 13.4 % (ref 11.5–15.5)
WBC: 8.3 10*3/uL (ref 4.0–10.5)

## 2022-03-07 LAB — TSH: TSH: 2.58 u[IU]/mL (ref 0.35–5.50)

## 2022-03-07 MED ORDER — LEVOFLOXACIN 500 MG PO TABS: 500.0000 mg | ORAL_TABLET | Freq: Every day | ORAL | 0 refills | Status: AC

## 2022-03-07 NOTE — Progress Notes (Signed)
Subjective:  Patient ID: Michael Miller, male    DOB: [birth year]  Age: 56 y.o. MRN: [id number]  CC: Testicle Pain   HPI Michael Miller presents for f/up -  He complains of a 2-week history of vague discomfort in his right scrotum.  He denies dysuria, hematuria, lesions, rash, or lymphadenopathy.  Outpatient Medications Prior to Visit  Medication Sig Dispense Refill   allopurinol (ZYLOPRIM) 300 MG tablet Take 1 tablet (300 mg total) by mouth daily. 90 tablet 1   atorvastatin (LIPITOR) 20 MG tablet Take 1 tablet (20 mg total) by mouth daily. 90 tablet 1   busPIRone (BUSPAR) 10 MG tablet Take 1 tablet (10 mg total) by mouth 2 (two) times daily. 180 tablet 1   clonazePAM (KLONOPIN) 0.5 MG tablet TAKE 1 TABLET BY MOUTH EVERY DAY AS NEEDED FOR ANXIETY 30 tablet 2   fluticasone (FLONASE) 50 MCG/ACT nasal spray Place 1 spray into both nostrils as needed.     olmesartan (BENICAR) 20 MG tablet TAKE 1 TABLET BY MOUTH EVERY DAY 90 tablet 1   Olopatadine-Mometasone (RYALTRIS) 665-25 MCG/ACT SUSP Place 2 sprays into the nose 2 (two) times daily. 29 g 11   sertraline (ZOLOFT) 50 MG tablet TAKE 1 TABLET BY MOUTH EVERY DAY 90 tablet 1   XYOSTED 75 MG/0.5ML SOAJ Inject 75 mg into the skin once a week. 6 mL 1   Eluxadoline (VIBERZI) 75 MG TABS Take 75 mg by mouth 2 (two) times daily with a meal. (Patient not taking: Reported on 03/07/2022) 180 tablet 1   loratadine (CLARITIN) 10 MG tablet Take 1 tablet (10 mg total) by mouth 2 (two) times daily as needed for allergies (Can take an extra dose during flare ups.). (Patient not taking: Reported on 03/07/2022) 60 tablet 11   Olopatadine HCl (PATADAY) 0.2 % SOLN Place 1 drop into both eyes 1 day or 1 dose. (Patient not taking: Reported on 03/07/2022) 2.5 mL 11   Propylene Glycol (SYSTANE COMPLETE) 0.6 % SOLN Apply 1 drop to eye 4 (four) times daily as needed. (Patient not taking: Reported on 03/07/2022) 15 mL 11   No facility-administered medications prior to visit.     ROS Review of Systems  Constitutional: Negative.  Negative for chills, fatigue and fever.  HENT: Negative.    Eyes: Negative.   Respiratory:  Negative for cough, chest tightness, shortness of breath and wheezing.   Cardiovascular:  Negative for chest pain, palpitations and leg swelling.  Gastrointestinal:  Negative for abdominal pain, diarrhea and nausea.  Endocrine: Negative.   Genitourinary:  Positive for testicular pain. Negative for decreased urine volume, difficulty urinating, dysuria, genital sores, hematuria, penile discharge, penile swelling, scrotal swelling and urgency.  Musculoskeletal: Negative.   Skin: Negative.   Neurological: Negative.  Negative for dizziness and weakness.  Hematological:  Negative for adenopathy. Does not bruise/bleed easily.  Psychiatric/Behavioral: Negative.      Objective:  BP 124/86 (BP Location: Right Arm, Patient Position: Sitting, Cuff Size: Large)   Pulse 67   Temp 98.3 F (36.8 C) (Oral)   Ht '5\' 10"'$  (1.778 m)   Wt 192 lb 4 oz (87.2 kg)   SpO2 96%   BMI 27.59 kg/m   BP Readings from Last 3 Encounters:  03/07/22 124/86  10/11/21 92/62  09/12/21 110/78    Wt Readings from Last 3 Encounters:  03/07/22 192 lb 4 oz (87.2 kg)  10/11/21 190 lb 9.6 oz (86.5 kg)  09/12/21 191 lb 6.4 oz (86.8 kg)  Physical Exam Vitals reviewed. Chaperone present: [facility] Nurse.  HENT:     Nose: Nose normal.     Mouth/Throat:     Mouth: Mucous membranes are moist.  Eyes:     General: No scleral icterus.    Conjunctiva/sclera: Conjunctivae normal.  Cardiovascular:     Rate and Rhythm: Normal rate and regular rhythm.     Heart sounds: No murmur heard. Pulmonary:     Effort: Pulmonary effort is normal.     Breath sounds: No stridor. No wheezing, rhonchi or rales.  Abdominal:     General: Abdomen is flat.     Palpations: There is no mass.     Tenderness: There is no abdominal tenderness. There is no guarding.     Hernia: No hernia is present.  There is no hernia in the left inguinal area or right inguinal area.  Genitourinary:    Pubic Area: No rash.      Penis: Normal and circumcised. No discharge.      Testes:        Right: Tenderness present. Mass or swelling not present. Right testis is descended.     Epididymis:     Right: Inflamed. Not enlarged. Tenderness present. No mass.     Left: Normal.  Musculoskeletal:        General: Normal range of motion.     Cervical back: Neck supple.     Right lower leg: No edema.     Left lower leg: No edema.  Lymphadenopathy:     Cervical: No cervical adenopathy.     Lower Body: No right inguinal adenopathy. No left inguinal adenopathy.  Skin:    General: Skin is warm and dry.  Neurological:     General: No focal deficit present.     Mental Status: He is alert.  Psychiatric:        Mood and Affect: Mood normal.        Behavior: Behavior normal.     Lab Results  Component Value Date   WBC 8.3 03/07/2022   HGB 16.7 03/07/2022   HCT 48.7 03/07/2022   PLT 251.0 03/07/2022   GLUCOSE 92 08/25/2021   CHOL 173 03/23/2021   TRIG 185.0 (H) 03/23/2021   HDL 48.60 03/23/2021   LDLCALC 87 03/23/2021   ALT 35 03/23/2021   AST 32 03/23/2021   NA 140 08/25/2021   K 3.9 08/25/2021   CL 102 08/25/2021   CREATININE 1.15 08/25/2021   BUN 19 08/25/2021   CO2 29 08/25/2021   TSH 2.58 03/07/2022   PSA 0.50 03/23/2021   HGBA1C 5.3 10/30/2019    CT CARDIAC SCORING (SELF PAY ONLY)  Addendum Date: 11/04/2021   ADDENDUM REPORT: 11/04/2021 10:07 CLINICAL DATA:  Cardiovascular Disease Risk stratification EXAM: Coronary Calcium Score TECHNIQUE: A gated, non-contrast computed tomography scan of the heart was performed using 59m slice thickness. Axial images were analyzed on a dedicated workstation. Calcium scoring of the coronary arteries was performed using the Agatston method. FINDINGS: Coronary arteries: Normal origins. Coronary Calcium Score: Left main: 0 Left anterior descending artery: 57  Left circumflex artery: 76 Right coronary artery: 0 Total: 133 Percentile: 81 Pericardium: Normal. Aorta: Borderline dilated caliber of ascending aorta. Aortic atherosclerosis noted. Mild aortic valve calcification. [facility]-cardiac: [facility] IMPRESSION: Coronary calcium score of 133. This was 81st percentile for age-, race-, and sex-matched controls. RECOMMENDATIONS: Coronary artery calcium (CAC) score is a strong predictor of incident coronary heart disease (CHD) and provides predictive  information beyond traditional risk factors. CAC scoring is reasonable to use in the decision to withhold, postpone, or initiate statin therapy in intermediate-risk or selected borderline-risk asymptomatic adults (age 85-75 years and LDL-C >=70 to <190 mg/dL) who do not have diabetes or established atherosclerotic cardiovascular disease (ASCVD).* In intermediate-risk (10-year ASCVD risk >=7.5% to <20%) adults or selected borderline-risk (10-year ASCVD risk >=5% to <7.5%) adults in whom a CAC score is measured for the purpose of making a treatment decision the following recommendations have been made: If CAC=0, it is reasonable to withhold statin therapy and reassess in 5 to 10 years, as long as higher risk conditions are absent (diabetes mellitus, family history of premature CHD in first degree relatives (males <55 years; females <65 years), cigarette smoking, or LDL >=190 mg/dL). If CAC is 1 to 99, it is reasonable to initiate statin therapy for patients >=3 years of age. If CAC is >=100 or >=75th percentile, it is reasonable to initiate statin therapy at any age. Cardiology referral should be considered for patients with CAC scores >=400 or >=75th percentile. *2018 AHA/ACC/AACVPR/AAPA/ABC/ACPM/ADA/AGS/APhA/ASPC/NLA/PCNA Guideline on the Management of Blood Cholesterol: A Report of the American College of Cardiology/American Heart Association Task Force on Clinical Practice Guidelines. J Am Coll  Cardiol. 2019;73(24):3168-3209. Buford Dresser, MD Electronically Signed   By: Buford Dresser M.D.   On: 11/04/2021 10:07   Result Date: 11/04/2021 EXAM: OVER-READ INTERPRETATION  CT CHEST The following report is an over-read performed by radiologist Dr. Aletta Edouard of [facility], [place] on 11/01/2021. This over-read does not include interpretation of cardiac or coronary anatomy or pathology. The coronary calcium score interpretation by the cardiologist is attached. COMPARISON:  None. FINDINGS: Vascular: The ascending thoracic aorta is top-normal in caliber measuring up to approximately 3.9 cm. Mediastinum/Nodes: Visualized mediastinum and hilar regions demonstrate no lymphadenopathy or masses. Lungs/Pleura: Visualized lungs show no evidence of pulmonary edema, consolidation, pneumothorax, nodule or pleural fluid. Upper Abdomen: No acute abnormality. Musculoskeletal: No chest wall mass or suspicious bone lesions identified. IMPRESSION: Top-normal caliber of the ascending thoracic aorta measuring up to approximately 3.9 cm. Electronically Signed: By: Aletta Edouard M.D. On: 11/01/2021 16:44    Assessment & Plan:   Davidson was seen today for testicle pain.  Diagnoses and all orders for this visit:  Epididymo-orchitis without abscess- His labs are normal.  Will treat with a FQ. -     Urinalysis, Routine w reflex microscopic; Future -     GC/Chlamydia Probe Amp; Future -     levofloxacin (LEVAQUIN) 500 MG tablet; Take 1 tablet (500 mg total) by mouth daily for 7 days. -     GC/Chlamydia Probe Amp -     Urinalysis, Routine w reflex microscopic  Primary hypertension- His blood pressure is well-controlled. -     TSH; Future -     CBC with Differential/Platelet; Future -     CBC with Differential/Platelet -     TSH  Hypogonadism male- His testosterone level is therapeutic. -     TSH; Future -     Testosterone Total,Free,Bio, Males; Future -     CBC with Differential/Platelet;  Future -     CBC with Differential/Platelet -     Testosterone Total,Free,Bio, Males -     TSH  Acquired hypothyroidism- He is euthyroid. -     TSH; Future -     TSH   I am having Michael Miller start on levofloxacin. I am also having him maintain his Viberzi, fluticasone, Ryaltris, loratadine,  Olopatadine HCl, Systane Complete, busPIRone, allopurinol, atorvastatin, olmesartan, clonazePAM, Xyosted, and sertraline.  Meds ordered this encounter  Medications   levofloxacin (LEVAQUIN) 500 MG tablet    Sig: Take 1 tablet (500 mg total) by mouth daily for 7 days.    Dispense:  7 tablet    Refill:  0     Follow-up: Return in about 6 weeks (around 04/18/2022).  Scarlette Calico, MD

## 2022-03-07 NOTE — Patient Instructions (Signed)
Epididymitis  Epididymitis is inflammation or swelling of the epididymis. This is caused by an infection. The epididymis is a cord-like structure that is located along the top and back part of the testicle. It collects and stores sperm from the testicle. This condition can also cause pain and swelling of the testicle and scrotum. Symptoms usually start suddenly (acute epididymitis). Sometimes epididymitis starts gradually and lasts for a while (chronic epididymitis). Chronic epididymitis may be harder to treat. What are the causes? In men ages 20-40, this condition is usually caused by a bacterial infection or a sexually transmitted infection (STI), such as gonorrhea or chlamydia. In men 40 and older, this condition is usually caused by bacteria from a urinary blockage or from abnormalities in the urinary system. These can result from: Having a tube placed into the bladder (urinary catheter). Having an enlarged or inflamed prostate gland. Having recently had urinary tract surgery. Having a problem with a backward flow of urine (retrograde). In men who have a condition that weakens the body's defense system (immune system), such as human immunodeficiency virus (HIV), this condition can be caused by: Other bacteria, including tuberculosis and syphilis. Viruses. Fungi. Sometimes this condition occurs without infection. This may happen because of trauma or repetitive activities such as sports. What increases the risk? You are more likely to develop this condition if you have: Unprotected sex with more than one partner. Anal sex. Had recent surgery. A urinary catheter. Urinary problems. A suppressed immune system. What are the signs or symptoms? This condition usually begins suddenly with chills, fever, and pain behind the scrotum and in the testicle. Other symptoms include: Swelling of the scrotum, testicle, or both. Pain when ejaculating or urinating. Pain in the back or  abdomen. Nausea. Itching and discharge from the penis. A frequent need to pass urine. Redness, increased warmth, and tenderness of the scrotum. How is this diagnosed? Your health care provider can diagnose this condition based on your symptoms and medical history. Your health care provider will also do a physical exam to check your scrotum and testicle for swelling, pain, and redness. You may also have other tests, including: Testing of discharge from the penis. Testing your urine for infections, such as STIs. Ultrasound to check for blood flow and inflammation. Your health care provider may test you for other STIs, including HIV. How is this treated? Treatment for this condition depends on the cause. If your condition is caused by a bacterial infection, oral antibiotic medicine may be prescribed. If the bacterial infection has spread to your blood, you may need to receive IV antibiotics. For both bacterial and nonbacterial epididymitis, you may be treated with: Rest. Elevation of the scrotum. Pain medicines. Anti-inflammatory medicines. Surgery may be needed if: You have pus buildup in the scrotum (abscess). You have epididymitis that has not responded to other treatments. Follow these instructions at home: Medicines Take over-the-counter and prescription medicines only as told by your health care provider. If you were prescribed an antibiotic medicine, take it as told by your health care provider. Do not stop taking the antibiotic even if your condition improves. Sexual activity If your epididymitis was caused by an STI, avoid sexual activity until your treatment is complete. Inform your sexual partner or partners if you test positive for an STI. They may need to be treated. Do not engage in sexual activity with your partner or partners until their treatment is completed. Managing pain and swelling  If directed, raise (elevate) your scrotum and apply ice.   To do this: Put ice in a  plastic bag. Place a small towel or pillow between your legs. Rest your scrotum on the pillow or towel. Place another towel between your skin and the plastic bag. Leave the ice on for 20 minutes, 2-3 times a day. Remove the ice if your skin turns bright red. This is very important. If you cannot feel pain, heat, or cold, you have a greater risk of damage to the area. Keep your scrotum elevated and supported while resting. Ask your health care provider if you should wear a scrotal support, such as a jockstrap. Wear it as told by your health care provider. Try taking a sitz bath to help with discomfort. This is a warm water bath that is taken while you are sitting down. The water should come up to your hips and should cover your buttocks. Do this 3-4 times per day or as told by your health care provider. General instructions Drink enough fluid to keep your urine pale yellow. Return to your normal activities as told by your health care provider. Ask your health care provider what activities are safe for you. Keep all follow-up visits. This is important. Contact a health care provider if: You have a fever. Your pain medicine is not helping. Your pain is getting worse. Your symptoms do not improve within 3 days. Summary Epididymitis is inflammation or swelling of the epididymis. This is caused by an infection. This condition can also cause pain and swelling of the testicle and scrotum. Treatment for this condition depends on the cause. If your condition is caused by a bacterial infection, oral antibiotic medicine may be prescribed. Inform your sexual partner or partners if you test positive for an STI. They may need to be treated. Do not engage in sexual activity with your partner or partners until their treatment is completed. Contact a health care provider if your symptoms do not improve within 3 days. This information is not intended to replace advice given to you by your health care provider.  Make sure you discuss any questions you have with your health care provider. Document Revised: 02/23/2021 Document Reviewed: 02/23/2021 Elsevier Patient Education  2023 [employer]vier [employer].  

## 2022-03-08 LAB — TESTOSTERONE TOTAL,FREE,BIO, MALES
Albumin: 4.7 g/dL (ref 3.6–5.1)
Sex Hormone Binding: 20 nmol/L — ABNORMAL LOW (ref 22–77)
Testosterone, Bioavailable: 476.7 ng/dL (ref 110.0–575.0)
Testosterone, Free: 222.4 pg/mL (ref 46.0–224.0)
Testosterone: 979 ng/dL — ABNORMAL HIGH (ref 250–827)

## 2022-03-08 LAB — URINALYSIS, ROUTINE W REFLEX MICROSCOPIC
Bilirubin Urine: NEGATIVE
Ketones, ur: NEGATIVE
Leukocytes,Ua: NEGATIVE
Nitrite: NEGATIVE
Specific Gravity, Urine: 1.01 (ref 1.000–1.030)
Total Protein, Urine: NEGATIVE
Urine Glucose: NEGATIVE
Urobilinogen, UA: 0.2 (ref 0.0–1.0)
pH: 6 (ref 5.0–8.0)

## 2022-03-10 LAB — GC/CHLAMYDIA PROBE AMP
Chlamydia trachomatis, NAA: NEGATIVE
Neisseria Gonorrhoeae by PCR: NEGATIVE

## 2022-03-14 ENCOUNTER — Other Ambulatory Visit: Payer: Self-pay | Admitting: Internal Medicine

## 2022-03-14 DIAGNOSIS — F411 Generalized anxiety disorder: Secondary | ICD-10-CM

## 2022-03-28 DIAGNOSIS — H02423 Myogenic ptosis of bilateral eyelids: Secondary | ICD-10-CM | POA: Diagnosis not present

## 2022-03-28 DIAGNOSIS — H02831 Dermatochalasis of right upper eyelid: Secondary | ICD-10-CM | POA: Diagnosis not present

## 2022-03-28 DIAGNOSIS — H02413 Mechanical ptosis of bilateral eyelids: Secondary | ICD-10-CM | POA: Diagnosis not present

## 2022-03-28 DIAGNOSIS — H02834 Dermatochalasis of left upper eyelid: Secondary | ICD-10-CM | POA: Diagnosis not present

## 2022-03-28 DIAGNOSIS — H0279 Other degenerative disorders of eyelid and periocular area: Secondary | ICD-10-CM | POA: Diagnosis not present

## 2022-03-29 ENCOUNTER — Encounter: Payer: Self-pay | Admitting: Internal Medicine

## 2022-03-30 ENCOUNTER — Ambulatory Visit (INDEPENDENT_AMBULATORY_CARE_PROVIDER_SITE_OTHER): Payer: BC Managed Care – PPO | Admitting: Family Medicine

## 2022-03-30 ENCOUNTER — Encounter: Payer: Self-pay | Admitting: Family Medicine

## 2022-03-30 VITALS — BP 136/80 | HR 85 | Temp 97.8°F | Ht 70.0 in | Wt 195.0 lb

## 2022-03-30 DIAGNOSIS — H6982 Other specified disorders of Eustachian tube, left ear: Secondary | ICD-10-CM | POA: Diagnosis not present

## 2022-03-30 DIAGNOSIS — R0981 Nasal congestion: Secondary | ICD-10-CM | POA: Diagnosis not present

## 2022-03-30 DIAGNOSIS — J3489 Other specified disorders of nose and nasal sinuses: Secondary | ICD-10-CM

## 2022-03-30 DIAGNOSIS — H938X2 Other specified disorders of left ear: Secondary | ICD-10-CM

## 2022-03-30 DIAGNOSIS — J029 Acute pharyngitis, unspecified: Secondary | ICD-10-CM | POA: Diagnosis not present

## 2022-03-30 MED ORDER — CIPROFLOXACIN-DEXAMETHASONE 0.3-0.1 % OT SUSP: 4.0000 [drp] | Freq: Two times a day (BID) | OTIC | 0 refills | Status: DC

## 2022-03-30 NOTE — Progress Notes (Signed)
Subjective:  Michael Miller is a 56 y.o. male who presents for a 6 day hx of bodyaches, scratchy throat, rhinorrhea, nasal congestion and left ear fullness.   Denies fever, chills, dizziness, headache, cough, shortness of breath, abdominal pain, N/V/D.   Treatment to date: OTC eardrops, ibuprofen.  Denies sick contacts.  No other aggravating or relieving factors.  No other c/o.  ROS as in subjective.   Objective: Vitals:   03/30/22 1442  BP: 136/80  Pulse: 85  Temp: 97.8 F (36.6 C)  SpO2: 98%    General appearance: Alert, WD/WN, no distress, well appearing                             Skin: warm, no rash                           Head: no sinus tenderness                            Eyes: conjunctiva normal, corneas clear, PERRLA                            Ears: right TM normal, left TIM with mild erythema, clear fluid, external ear canals normal                          Nose: septum midline, turbinates swollen, with erythema and clear discharge             Mouth/throat: MMM, tongue normal, mild pharyngeal erythema                           Neck: supple, no adenopathy, no thyromegaly, nontender                          Heart: RRR, normal S1, S2, no murmurs                         Lungs: CTA bilaterally, no wheezes, rales, or rhonchi      Assessment: Eustachian tube dysfunction, left - Plan: ciprofloxacin-dexamethasone (CIPRODEX) OTIC suspension  Nasal congestion with rhinorrhea  Ear fullness, left  Acute pharyngitis, unspecified etiology   Plan: Negative Covid test.  Ciprodex Otic prescribed.  Suggested symptomatic OTC remedies. Nasal saline spray and Flonase for congestion.  Tylenol or Ibuprofen OTC prn.   Recommend avoid swimming with head submerged.  Call/return if symptoms aren't improving and he may need an oral antibiotic at that point.

## 2022-03-30 NOTE — Patient Instructions (Signed)
Use the ear drops as prescribed.   Take tylenol or ibuprofen as needed for sore throat, headache, body aches,   Salt water gargles or Chloraseptic for sore throat  Claritin, Xyzal or Zyrtec for runny nose, sneezing or itching.  Flonase or Nasacort for nasal congestion  Let me know if you are having any new or worsening symptoms.

## 2022-04-10 ENCOUNTER — Other Ambulatory Visit: Payer: Self-pay | Admitting: Internal Medicine

## 2022-04-10 DIAGNOSIS — I10 Essential (primary) hypertension: Secondary | ICD-10-CM

## 2022-04-10 DIAGNOSIS — H53483 Generalized contraction of visual field, bilateral: Secondary | ICD-10-CM | POA: Diagnosis not present

## 2022-05-09 ENCOUNTER — Other Ambulatory Visit: Payer: Self-pay | Admitting: Internal Medicine

## 2022-05-09 DIAGNOSIS — M1A09X0 Idiopathic chronic gout, multiple sites, without tophus (tophi): Secondary | ICD-10-CM

## 2022-05-09 DIAGNOSIS — F411 Generalized anxiety disorder: Secondary | ICD-10-CM

## 2022-05-29 ENCOUNTER — Encounter: Payer: Self-pay | Admitting: Internal Medicine

## 2022-05-30 ENCOUNTER — Other Ambulatory Visit: Payer: Self-pay | Admitting: Internal Medicine

## 2022-05-30 DIAGNOSIS — E291 Testicular hypofunction: Secondary | ICD-10-CM

## 2022-05-30 MED ORDER — XYOSTED 75 MG/0.5ML ~~LOC~~ SOAJ: 75.0000 mg | SUBCUTANEOUS | 0 refills | Status: DC

## 2022-05-31 ENCOUNTER — Other Ambulatory Visit: Payer: Self-pay | Admitting: Internal Medicine

## 2022-05-31 DIAGNOSIS — I10 Essential (primary) hypertension: Secondary | ICD-10-CM

## 2022-05-31 DIAGNOSIS — E785 Hyperlipidemia, unspecified: Secondary | ICD-10-CM

## 2022-05-31 MED ORDER — ATORVASTATIN CALCIUM 20 MG PO TABS: 20.0000 mg | ORAL_TABLET | Freq: Every day | ORAL | 0 refills | Status: DC

## 2022-06-02 ENCOUNTER — Other Ambulatory Visit: Payer: Self-pay | Admitting: Internal Medicine

## 2022-06-02 ENCOUNTER — Other Ambulatory Visit (HOSPITAL_COMMUNITY): Payer: Self-pay

## 2022-06-02 DIAGNOSIS — F411 Generalized anxiety disorder: Secondary | ICD-10-CM

## 2022-06-08 ENCOUNTER — Other Ambulatory Visit: Payer: Self-pay | Admitting: Internal Medicine

## 2022-06-08 DIAGNOSIS — F411 Generalized anxiety disorder: Secondary | ICD-10-CM

## 2022-06-09 ENCOUNTER — Encounter: Payer: Self-pay | Admitting: Internal Medicine

## 2022-06-09 ENCOUNTER — Other Ambulatory Visit: Payer: Self-pay | Admitting: Internal Medicine

## 2022-06-09 DIAGNOSIS — L659 Nonscarring hair loss, unspecified: Secondary | ICD-10-CM | POA: Insufficient documentation

## 2022-06-09 MED ORDER — FINASTERIDE 1 MG PO TABS: 1.0000 mg | ORAL_TABLET | Freq: Every day | ORAL | 1 refills | Status: DC

## 2022-06-12 ENCOUNTER — Other Ambulatory Visit: Payer: Self-pay | Admitting: Internal Medicine

## 2022-06-19 ENCOUNTER — Telehealth: Payer: BC Managed Care – PPO | Admitting: Family Medicine

## 2022-06-19 DIAGNOSIS — J019 Acute sinusitis, unspecified: Secondary | ICD-10-CM | POA: Diagnosis not present

## 2022-06-19 DIAGNOSIS — B9689 Other specified bacterial agents as the cause of diseases classified elsewhere: Secondary | ICD-10-CM

## 2022-06-19 MED ORDER — AMOXICILLIN-POT CLAVULANATE 875-125 MG PO TABS: 1.0000 | ORAL_TABLET | Freq: Two times a day (BID) | ORAL | 0 refills | Status: AC

## 2022-06-19 NOTE — Progress Notes (Signed)

## 2022-06-26 ENCOUNTER — Encounter: Payer: Self-pay | Admitting: Internal Medicine

## 2022-07-03 DIAGNOSIS — G4733 Obstructive sleep apnea (adult) (pediatric): Secondary | ICD-10-CM | POA: Diagnosis not present

## 2022-07-18 ENCOUNTER — Telehealth: Payer: Self-pay | Admitting: Internal Medicine

## 2022-07-18 NOTE — Telephone Encounter (Signed)
Patient called and stated he stop taking his statin and it has been about 2 weeks , he wants to know if there are any concerns or if it is okay that he has been off of it, he says he does feel better. He would like a call back to discuss at [phone].

## 2022-07-18 NOTE — Telephone Encounter (Signed)
Pt has been informed ok to stop taking the statin, He expressed understanding.

## 2022-07-19 ENCOUNTER — Ambulatory Visit: Payer: BC Managed Care – PPO | Admitting: Internal Medicine

## 2022-07-27 ENCOUNTER — Encounter: Payer: Self-pay | Admitting: Internal Medicine

## 2022-07-27 ENCOUNTER — Ambulatory Visit (INDEPENDENT_AMBULATORY_CARE_PROVIDER_SITE_OTHER): Payer: BC Managed Care – PPO | Admitting: Internal Medicine

## 2022-07-27 ENCOUNTER — Other Ambulatory Visit: Payer: Self-pay | Admitting: Internal Medicine

## 2022-07-27 VITALS — BP 132/84 | HR 80 | Temp 98.1°F | Resp 16 | Ht 70.0 in | Wt 195.0 lb

## 2022-07-27 DIAGNOSIS — E785 Hyperlipidemia, unspecified: Secondary | ICD-10-CM

## 2022-07-27 DIAGNOSIS — E782 Mixed hyperlipidemia: Secondary | ICD-10-CM | POA: Diagnosis not present

## 2022-07-27 DIAGNOSIS — I10 Essential (primary) hypertension: Secondary | ICD-10-CM | POA: Diagnosis not present

## 2022-07-27 DIAGNOSIS — M1A09X0 Idiopathic chronic gout, multiple sites, without tophus (tophi): Secondary | ICD-10-CM | POA: Diagnosis not present

## 2022-07-27 DIAGNOSIS — Z0001 Encounter for general adult medical examination with abnormal findings: Secondary | ICD-10-CM

## 2022-07-27 DIAGNOSIS — E039 Hypothyroidism, unspecified: Secondary | ICD-10-CM

## 2022-07-27 DIAGNOSIS — F411 Generalized anxiety disorder: Secondary | ICD-10-CM | POA: Diagnosis not present

## 2022-07-27 DIAGNOSIS — Z6827 Body mass index (BMI) 27.0-27.9, adult: Secondary | ICD-10-CM

## 2022-07-27 DIAGNOSIS — E781 Pure hyperglyceridemia: Secondary | ICD-10-CM

## 2022-07-27 LAB — CBC WITH DIFFERENTIAL/PLATELET
Basophils Absolute: 0 10*3/uL (ref 0.0–0.1)
Basophils Relative: 0.5 % (ref 0.0–3.0)
Eosinophils Absolute: 0.1 10*3/uL (ref 0.0–0.7)
Eosinophils Relative: 1.6 % (ref 0.0–5.0)
HCT: 50.5 % (ref 39.0–52.0)
Hemoglobin: 17.3 g/dL — ABNORMAL HIGH (ref 13.0–17.0)
Lymphocytes Relative: 23.5 % (ref 12.0–46.0)
Lymphs Abs: 1.9 10*3/uL (ref 0.7–4.0)
MCHC: 34.3 g/dL (ref 30.0–36.0)
MCV: 91.2 fl (ref 78.0–100.0)
Monocytes Absolute: 0.8 10*3/uL (ref 0.1–1.0)
Monocytes Relative: 9.8 % (ref 3.0–12.0)
Neutro Abs: 5.3 10*3/uL (ref 1.4–7.7)
Neutrophils Relative %: 64.6 % (ref 43.0–77.0)
Platelets: 272 10*3/uL (ref 150.0–400.0)
RBC: 5.54 Mil/uL (ref 4.22–5.81)
RDW: 14 % (ref 11.5–15.5)
WBC: 8.2 10*3/uL (ref 4.0–10.5)

## 2022-07-27 LAB — BASIC METABOLIC PANEL
BUN: 20 mg/dL (ref 6–23)
CO2: 29 mEq/L (ref 19–32)
Calcium: 9.3 mg/dL (ref 8.4–10.5)
Chloride: 100 mEq/L (ref 96–112)
Creatinine, Ser: 1.17 mg/dL (ref 0.40–1.50)
GFR: 69.15 mL/min (ref >60.00)
Glucose, Bld: 85 mg/dL (ref 70–99)
Potassium: 3.9 mEq/L (ref 3.5–5.1)
Sodium: 138 mEq/L (ref 135–145)

## 2022-07-27 LAB — URIC ACID: Uric Acid, Serum: 6.1 mg/dL (ref 4.0–7.8)

## 2022-07-27 LAB — HEPATIC FUNCTION PANEL
ALT: 29 U/L (ref 0–53)
AST: 28 U/L (ref 0–37)
Albumin: 4.7 g/dL (ref 3.5–5.2)
Alkaline Phosphatase: 54 U/L (ref 39–117)
Bilirubin, Direct: 0.1 mg/dL (ref 0.0–0.3)
Total Bilirubin: 0.8 mg/dL (ref 0.2–1.2)
Total Protein: 7.2 g/dL (ref 6.0–8.3)

## 2022-07-27 LAB — LIPID PANEL
Cholesterol: 234 mg/dL — ABNORMAL HIGH (ref 0–200)
HDL: 42.4 mg/dL (ref >39.00)
Total CHOL/HDL Ratio: 6
Triglycerides: 737 mg/dL — ABNORMAL HIGH (ref 0.0–149.0)

## 2022-07-27 LAB — LDL CHOLESTEROL, DIRECT: Direct LDL: 100 mg/dL

## 2022-07-27 MED ORDER — SEMAGLUTIDE-WEIGHT MANAGEMENT 1 MG/0.5ML ~~LOC~~ SOAJ: 1.0000 mg | SUBCUTANEOUS | 0 refills | Status: DC

## 2022-07-27 MED ORDER — SEMAGLUTIDE-WEIGHT MANAGEMENT 1.7 MG/0.75ML ~~LOC~~ SOAJ: 1.7000 mg | SUBCUTANEOUS | 0 refills | Status: DC

## 2022-07-27 MED ORDER — SEMAGLUTIDE-WEIGHT MANAGEMENT 2.4 MG/0.75ML ~~LOC~~ SOAJ: 2.4000 mg | SUBCUTANEOUS | 0 refills | Status: DC

## 2022-07-27 MED ORDER — SEMAGLUTIDE-WEIGHT MANAGEMENT 0.25 MG/0.5ML ~~LOC~~ SOAJ: 0.2500 mg | SUBCUTANEOUS | 0 refills | Status: DC

## 2022-07-27 MED ORDER — SEMAGLUTIDE-WEIGHT MANAGEMENT 0.5 MG/0.5ML ~~LOC~~ SOAJ: 0.5000 mg | SUBCUTANEOUS | 0 refills | Status: DC

## 2022-07-27 NOTE — Progress Notes (Signed)
Subjective:  Patient ID: Michael Miller, male    DOB: Dec 23, 1965  Age: 56 y.o. MRN: [id number]  CC: Annual Exam, Hyperlipidemia, and Hypertension   HPI Michael Miller presents for a CPX and f/up -   He is active and denies DOE, CP, SOB, edema.  He would like to use Wegovy to help him lose weight.  Outpatient Medications Prior to Visit  Medication Sig Dispense Refill   allopurinol (ZYLOPRIM) 300 MG tablet TAKE 1 TABLET DAILY 90 tablet 1   busPIRone (BUSPAR) 10 MG tablet TAKE 1 TABLET TWICE A DAY 180 tablet 1   clonazePAM (KLONOPIN) 0.5 MG tablet TAKE 1 TABLET BY MOUTH EVERY DAY AS NEEDED FOR ANXIETY 30 tablet 2   finasteride (PROPECIA) 1 MG tablet Take 1 tablet (1 mg total) by mouth daily. 90 tablet 1   fluticasone (FLONASE) 50 MCG/ACT nasal spray Place 1 spray into both nostrils as needed.     olmesartan (BENICAR) 20 MG tablet TAKE 1 TABLET DAILY 90 tablet 0   sertraline (ZOLOFT) 50 MG tablet TAKE 1 TABLET DAILY 90 tablet 1   XYOSTED 75 MG/0.5ML SOAJ Inject 75 mg into the skin once a week. 6 mL 0   atorvastatin (LIPITOR) 20 MG tablet Take 1 tablet (20 mg total) by mouth daily. 90 tablet 0   Eluxadoline (VIBERZI) 75 MG TABS Take 75 mg by mouth 2 (two) times daily with a meal. 180 tablet 1   loratadine (CLARITIN) 10 MG tablet Take 1 tablet (10 mg total) by mouth 2 (two) times daily as needed for allergies (Can take an extra dose during flare ups.). 60 tablet 11   Olopatadine HCl (PATADAY) 0.2 % SOLN Place 1 drop into both eyes 1 day or 1 dose. 2.5 mL 11   Olopatadine-Mometasone (RYALTRIS) 665-25 MCG/ACT SUSP Place 2 sprays into the nose 2 (two) times daily. 29 g 11   Propylene Glycol (SYSTANE COMPLETE) 0.6 % SOLN Apply 1 drop to eye 4 (four) times daily as needed. 15 mL 11   No facility-administered medications prior to visit.    ROS Review of Systems  Constitutional: Negative.  Negative for diaphoresis, fatigue and unexpected weight change.  HENT: Negative.    Eyes: Negative.    Respiratory:  Negative for cough, chest tightness, shortness of breath and wheezing.   Cardiovascular:  Negative for chest pain, palpitations and leg swelling.  Gastrointestinal:  Negative for abdominal pain, constipation, diarrhea, nausea and vomiting.  Endocrine: Negative.   Genitourinary: Negative.  Negative for difficulty urinating, dysuria, penile pain, penile swelling and scrotal swelling.  Musculoskeletal:  Positive for arthralgias. Negative for myalgias.  Skin: Negative.   Neurological: Negative.  Negative for dizziness, weakness and light-headedness.  Hematological:  Negative for adenopathy. Does not bruise/bleed easily.  Psychiatric/Behavioral:  Positive for sleep disturbance. Negative for decreased concentration and dysphoric mood. The patient is nervous/anxious.     Objective:  BP 132/84 (BP Location: Left Arm, Patient Position: Sitting, Cuff Size: Large)   Pulse 80   Temp 98.1 F (36.7 C) (Oral)   Resp 16   Ht 5\' 10"  (1.778 m)   Wt 195 lb (88.5 kg)   SpO2 95%   BMI 27.98 kg/m   BP Readings from Last 3 Encounters:  07/27/22 132/84  03/30/22 136/80  03/07/22 124/86    Wt Readings from Last 3 Encounters:  07/27/22 195 lb (88.5 kg)  03/30/22 195 lb (88.5 kg)  03/07/22 192 lb 4 oz (87.2 kg)    Physical Exam Vitals  reviewed.  HENT:     Nose: Nose normal.     Mouth/Throat:     Mouth: Mucous membranes are moist.  Eyes:     General: No scleral icterus.    Conjunctiva/sclera: Conjunctivae normal.  Cardiovascular:     Rate and Rhythm: Normal rate and regular rhythm.     Heart sounds: No murmur heard. Pulmonary:     Effort: Pulmonary effort is normal.     Breath sounds: No stridor. No wheezing, rhonchi or rales.  Abdominal:     General: Abdomen is flat.     Palpations: There is no mass.     Tenderness: There is no abdominal tenderness. There is no guarding or rebound.     Hernia: No hernia is present. There is no hernia in the left inguinal area or right  inguinal area.  Genitourinary:    Pubic Area: No rash.      Penis: Normal and circumcised.      Testes: Normal.     Epididymis:     Right: Normal.     Left: Normal.     Prostate: Normal. Not enlarged, not tender and no nodules present.     Rectum: Guaiac result negative. Internal hemorrhoid present. No mass, tenderness, anal fissure or external hemorrhoid. Normal anal tone.  Musculoskeletal:        General: Normal range of motion.     Cervical back: Neck supple.     Right lower leg: No edema.     Left lower leg: No edema.  Lymphadenopathy:     Cervical: No cervical adenopathy.     Lower Body: No right inguinal adenopathy. No left inguinal adenopathy.  Skin:    General: Skin is warm and dry.     Coloration: Skin is not pale.  Neurological:     General: No focal deficit present.     Mental Status: He is alert. Mental status is at baseline.  Psychiatric:        Attention and Perception: He is inattentive.        Mood and Affect: Mood is anxious.        Speech: Speech is not rapid and pressured, delayed, slurred or tangential.        Behavior: Behavior normal.        Thought Content: Thought content normal. Thought content is not paranoid or delusional. Thought content does not include homicidal or suicidal ideation.     Lab Results  Component Value Date   WBC 8.2 07/27/2022   HGB 17.3 (H) 07/27/2022   HCT 50.5 07/27/2022   PLT 272.0 07/27/2022   GLUCOSE 85 07/27/2022   CHOL 234 (H) 07/27/2022   TRIG (H) 07/27/2022    737.0 Triglyceride is over 400; calculations on Lipids are invalid.   HDL 42.40 07/27/2022   LDLDIRECT 100.0 07/27/2022   LDLCALC 87 03/23/2021   ALT 29 07/27/2022   AST 28 07/27/2022   NA 138 07/27/2022   K 3.9 07/27/2022   CL 100 07/27/2022   CREATININE 1.17 07/27/2022   BUN 20 07/27/2022   CO2 29 07/27/2022   TSH 2.58 07/27/2022   PSA 0.52 07/27/2022   HGBA1C 5.3 10/30/2019    CT CARDIAC SCORING (SELF PAY ONLY)  Addendum Date: 11/04/2021    ADDENDUM REPORT: 11/04/2021 10:07 CLINICAL DATA:  Cardiovascular Disease Risk stratification EXAM: Coronary Calcium Score TECHNIQUE: A gated, non-contrast computed tomography scan of the heart was performed using 3mm slice thickness. Axial images were analyzed on a dedicated workstation.  Calcium scoring of the coronary arteries was performed using the Agatston method. FINDINGS: Coronary arteries: Normal origins. Coronary Calcium Score: Left main: 0 Left anterior descending artery: 57 Left circumflex artery: 76 Right coronary artery: 0 Total: 133 Percentile: 81 Pericardium: Normal. Aorta: Borderline dilated caliber of ascending aorta. Aortic atherosclerosis noted. Mild aortic valve calcification. Non-cardiac: See separate report from [facility]. IMPRESSION: Coronary calcium score of 133. This was 81st percentile for age-, race-, and sex-matched controls. RECOMMENDATIONS: Coronary artery calcium (CAC) score is a strong predictor of incident coronary heart disease (CHD) and provides predictive information beyond traditional risk factors. CAC scoring is reasonable to use in the decision to withhold, postpone, or initiate statin therapy in intermediate-risk or selected borderline-risk asymptomatic adults (age 71-75 years and LDL-C >=70 to <190 mg/dL) who do not have diabetes or established atherosclerotic cardiovascular disease (ASCVD).* In intermediate-risk (10-year ASCVD risk >=7.5% to <20%) adults or selected borderline-risk (10-year ASCVD risk >=5% to <7.5%) adults in whom a CAC score is measured for the purpose of making a treatment decision the following recommendations have been made: If CAC=0, it is reasonable to withhold statin therapy and reassess in 5 to 10 years, as long as higher risk conditions are absent (diabetes mellitus, family history of premature CHD in first degree relatives (males <55 years; females <65 years), cigarette smoking, or LDL >=190 mg/dL). If CAC is 1 to 99, it is reasonable  to initiate statin therapy for patients >=67 years of age. If CAC is >=100 or >=75th percentile, it is reasonable to initiate statin therapy at any age. Cardiology referral should be considered for patients with CAC scores >=400 or >=75th percentile. *2018 AHA/ACC/AACVPR/AAPA/ABC/ACPM/ADA/AGS/APhA/ASPC/NLA/PCNA Guideline on the Management of Blood Cholesterol: A Report of the American College of Cardiology/American Heart Association Task Force on Clinical Practice Guidelines. J Am Coll Cardiol. 2019;73(24):3168-3209. Jodelle Red, MD Electronically Signed   By: Jodelle Red M.D.   On: 11/04/2021 10:07   Result Date: 11/04/2021 EXAM: OVER-READ INTERPRETATION  CT CHEST The following report is an over-read performed by radiologist Dr. Irish Lack of [facility] on 11/01/2021. This over-read does not include interpretation of cardiac or coronary anatomy or pathology. The coronary calcium score interpretation by the cardiologist is attached. COMPARISON:  None. FINDINGS: Vascular: The ascending thoracic aorta is top-normal in caliber measuring up to approximately 3.9 cm. Mediastinum/Nodes: Visualized mediastinum and hilar regions demonstrate no lymphadenopathy or masses. Lungs/Pleura: Visualized lungs show no evidence of pulmonary edema, consolidation, pneumothorax, nodule or pleural fluid. Upper Abdomen: No acute abnormality. Musculoskeletal: No chest wall mass or suspicious bone lesions identified. IMPRESSION: Top-normal caliber of the ascending thoracic aorta measuring up to approximately 3.9 cm. Electronically Signed: By: Irish Lack M.D. On: 11/01/2021 16:44    Assessment & Plan:   Tage was seen today for annual exam, hyperlipidemia and hypertension.  Diagnoses and all orders for this visit:  Primary hypertension- His blood pressure is adequately well-controlled. -     Basic metabolic panel; Future -     Hepatic function panel; Future -     CBC with  Differential/Platelet; Future -     CBC with Differential/Platelet -     Hepatic function panel -     Basic metabolic panel  Acquired hypothyroidism- He is euthyroid. -     TSH; Future -     CBC with Differential/Platelet; Future -     CBC with Differential/Platelet -     TSH  Hyperlipidemia with target LDL less than 130- His ASCVD  risk score is 11%.  I did not I do not recommend that he take a statin. -     Lipid panel; Future -     TSH; Future -     Hepatic function panel; Future -     Hepatic function panel -     TSH -     Lipid panel  Encounter for general adult medical examination with abnormal findings- Exam completed, labs reviewed, vaccines reviewed and updated, cancer screenings are up-to-date, patient education was given. -     PSA; Future -     PSA  GAD (generalized anxiety disorder) -     TSH; Future -     TSH  Idiopathic chronic gout of multiple sites without tophus- He has achieved his uric acid goal. -     Uric acid; Future -     Uric acid  BMI 27.0-27.9,adult -     Discontinue: Semaglutide-Weight Management 0.25 MG/0.5ML SOAJ; Inject 0.25 mg into the skin once a week for 28 days. -     Discontinue: Semaglutide-Weight Management 0.5 MG/0.5ML SOAJ; Inject 0.5 mg into the skin once a week for 28 days. -     Discontinue: Semaglutide-Weight Management 1 MG/0.5ML SOAJ; Inject 1 mg into the skin once a week for 28 days. -     Discontinue: Semaglutide-Weight Management 1.7 MG/0.75ML SOAJ; Inject 1.7 mg into the skin once a week for 28 days. -     Discontinue: Semaglutide-Weight Management 2.4 MG/0.75ML SOAJ; Inject 2.4 mg into the skin once a week for 28 days. -     Semaglutide-Weight Management 0.25 MG/0.5ML SOAJ; Inject 0.25 mg into the skin once a week for 28 days. -     Semaglutide-Weight Management 0.5 MG/0.5ML SOAJ; Inject 0.5 mg into the skin once a week for 28 days. -     Semaglutide-Weight Management 1 MG/0.5ML SOAJ; Inject 1 mg into the skin once a week for 28  days. -     Semaglutide-Weight Management 1.7 MG/0.75ML SOAJ; Inject 1.7 mg into the skin once a week for 28 days.  Hypertriglyceridemia- Will treat with an omega-3 fish oil supplement. -     omega-3 acid ethyl esters (LOVAZA) 1 g capsule; Take 2 capsules (2 g total) by mouth 2 (two) times daily. -     Semaglutide-Weight Management 0.25 MG/0.5ML SOAJ; Inject 0.25 mg into the skin once a week for 28 days. -     Semaglutide-Weight Management 0.5 MG/0.5ML SOAJ; Inject 0.5 mg into the skin once a week for 28 days. -     Semaglutide-Weight Management 1 MG/0.5ML SOAJ; Inject 1 mg into the skin once a week for 28 days. -     Semaglutide-Weight Management 1.7 MG/0.75ML SOAJ; Inject 1.7 mg into the skin once a week for 28 days. -     Triglycerides; Future  Other orders -     LDL cholesterol, direct   I have discontinued Clydene Fake Viberzi, Ryaltris, loratadine, Olopatadine HCl, Systane Complete, atorvastatin, Semaglutide-Weight Management, Semaglutide-Weight Management, Semaglutide-Weight Management, Semaglutide-Weight Management, and Semaglutide-Weight Management. I am also having him start on omega-3 acid ethyl esters, Semaglutide-Weight Management, Semaglutide-Weight Management, Semaglutide-Weight Management, and Semaglutide-Weight Management. Additionally, I am having him maintain his fluticasone, olmesartan, busPIRone, allopurinol, Xyosted, sertraline, clonazePAM, and finasteride.  Meds ordered this encounter  Medications   DISCONTD: Semaglutide-Weight Management 0.25 MG/0.5ML SOAJ    Sig: Inject 0.25 mg into the skin once a week for 28 days.    Dispense:  2 mL  Refill:  0   DISCONTD: Semaglutide-Weight Management 0.5 MG/0.5ML SOAJ    Sig: Inject 0.5 mg into the skin once a week for 28 days.    Dispense:  2 mL    Refill:  0   DISCONTD: Semaglutide-Weight Management 1 MG/0.5ML SOAJ    Sig: Inject 1 mg into the skin once a week for 28 days.    Dispense:  2 mL    Refill:  0   DISCONTD:  Semaglutide-Weight Management 1.7 MG/0.75ML SOAJ    Sig: Inject 1.7 mg into the skin once a week for 28 days.    Dispense:  3 mL    Refill:  0   DISCONTD: Semaglutide-Weight Management 2.4 MG/0.75ML SOAJ    Sig: Inject 2.4 mg into the skin once a week for 28 days.    Dispense:  3 mL    Refill:  0   omega-3 acid ethyl esters (LOVAZA) 1 g capsule    Sig: Take 2 capsules (2 g total) by mouth 2 (two) times daily.    Dispense:  360 capsule    Refill:  1   Semaglutide-Weight Management 0.25 MG/0.5ML SOAJ    Sig: Inject 0.25 mg into the skin once a week for 28 days.    Dispense:  2 mL    Refill:  0   Semaglutide-Weight Management 0.5 MG/0.5ML SOAJ    Sig: Inject 0.5 mg into the skin once a week for 28 days.    Dispense:  2 mL    Refill:  0   Semaglutide-Weight Management 1 MG/0.5ML SOAJ    Sig: Inject 1 mg into the skin once a week for 28 days.    Dispense:  2 mL    Refill:  0   Semaglutide-Weight Management 1.7 MG/0.75ML SOAJ    Sig: Inject 1.7 mg into the skin once a week for 28 days.    Dispense:  3 mL    Refill:  0     Follow-up: Return in about 6 months (around 01/26/2023).  Sanda Linger, MD

## 2022-07-27 NOTE — Patient Instructions (Signed)
Health Maintenance, Male Adopting a healthy lifestyle and getting preventive care are important in promoting health and wellness. Ask your health care provider about: The right schedule for you to have regular tests and exams. Things you can do on your own to prevent diseases and keep yourself healthy. What should I know about diet, weight, and exercise? Eat a healthy diet  Eat a diet that includes plenty of vegetables, fruits, low-fat dairy products, and lean protein. Do not eat a lot of foods that are high in solid fats, added sugars, or sodium. Maintain a healthy weight Body mass index (BMI) is a measurement that can be used to identify possible weight problems. It estimates body fat based on height and weight. Your health care provider can help determine your BMI and help you achieve or maintain a healthy weight. Get regular exercise Get regular exercise. This is one of the most important things you can do for your health. Most adults should: Exercise for at least 150 minutes each week. The exercise should increase your heart rate and make you sweat (moderate-intensity exercise). Do strengthening exercises at least twice a week. This is in addition to the moderate-intensity exercise. Spend less time sitting. Even light physical activity can be beneficial. Watch cholesterol and blood lipids Have your blood tested for lipids and cholesterol at 57 years of age, then have this test every 5 years. You may need to have your cholesterol levels checked more often if: Your lipid or cholesterol levels are high. You are older than 57 years of age. You are at high risk for heart disease. What should I know about cancer screening? Many types of cancers can be detected early and may often be prevented. Depending on your health history and family history, you may need to have cancer screening at various ages. This may include screening for: Colorectal cancer. Prostate cancer. Skin cancer. Lung  cancer. What should I know about heart disease, diabetes, and high blood pressure? Blood pressure and heart disease High blood pressure causes heart disease and increases the risk of stroke. This is more likely to develop in people who have high blood pressure readings or are overweight. Talk with your health care provider about your target blood pressure readings. Have your blood pressure checked: Every 3-5 years if you are 18-39 years of age. Every year if you are 40 years old or older. If you are between the ages of 65 and 75 and are a current or former smoker, ask your health care provider if you should have a one-time screening for abdominal aortic aneurysm (AAA). Diabetes Have regular diabetes screenings. This checks your fasting blood sugar level. Have the screening done: Once every three years after age 45 if you are at a normal weight and have a low risk for diabetes. More often and at a younger age if you are overweight or have a high risk for diabetes. What should I know about preventing infection? Hepatitis B If you have a higher risk for hepatitis B, you should be screened for this virus. Talk with your health care provider to find out if you are at risk for hepatitis B infection. Hepatitis C Blood testing is recommended for: Everyone born from 1945 through 1965. Anyone with known risk factors for hepatitis C. Sexually transmitted infections (STIs) You should be screened each year for STIs, including gonorrhea and chlamydia, if: You are sexually active and are younger than 57 years of age. You are older than 57 years of age and your   health care provider tells you that you are at risk for this type of infection. Your sexual activity has changed since you were last screened, and you are at increased risk for chlamydia or gonorrhea. Ask your health care provider if you are at risk. Ask your health care provider about whether you are at high risk for HIV. Your health care provider  may recommend a prescription medicine to help prevent HIV infection. If you choose to take medicine to prevent HIV, you should first get tested for HIV. You should then be tested every 3 months for as long as you are taking the medicine. Follow these instructions at home: Alcohol use Do not drink alcohol if your health care provider tells you not to drink. If you drink alcohol: Limit how much you have to 0-2 drinks a day. Know how much alcohol is in your drink. In the [place] one drink equals one 12 oz bottle of beer (355 mL), one 5 oz glass of wine (148 mL), or one 1 oz glass of hard liquor (44 mL). Lifestyle Do not use any products that contain nicotine or tobacco. These products include cigarettes, chewing tobacco, and vaping devices, such as e-cigarettes. If you need help quitting, ask your health care provider. Do not use street drugs. Do not share needles. Ask your health care provider for help if you need support or information about quitting drugs. General instructions Schedule regular health, dental, and eye exams. Stay current with your vaccines. Tell your health care provider if: You often feel depressed. You have ever been abused or do not feel safe at home. Summary Adopting a healthy lifestyle and getting preventive care are important in promoting health and wellness. Follow your health care provider's instructions about healthy diet, exercising, and getting tested or screened for diseases. Follow your health care provider's instructions on monitoring your cholesterol and blood pressure. This information is not intended to replace advice given to you by your health care provider. Make sure you discuss any questions you have with your health care provider. Document Revised: 12/06/2020 Document Reviewed: 12/06/2020 Elsevier Patient Education  2023 [employer]  

## 2022-07-28 ENCOUNTER — Encounter: Payer: Self-pay | Admitting: Internal Medicine

## 2022-07-28 ENCOUNTER — Telehealth: Payer: Self-pay

## 2022-07-28 DIAGNOSIS — L814 Other melanin hyperpigmentation: Secondary | ICD-10-CM | POA: Diagnosis not present

## 2022-07-28 DIAGNOSIS — E781 Pure hyperglyceridemia: Secondary | ICD-10-CM | POA: Insufficient documentation

## 2022-07-28 DIAGNOSIS — L821 Other seborrheic keratosis: Secondary | ICD-10-CM | POA: Diagnosis not present

## 2022-07-28 DIAGNOSIS — L718 Other rosacea: Secondary | ICD-10-CM | POA: Diagnosis not present

## 2022-07-28 DIAGNOSIS — D1801 Hemangioma of skin and subcutaneous tissue: Secondary | ICD-10-CM | POA: Diagnosis not present

## 2022-07-28 LAB — TSH: TSH: 2.58 u[IU]/mL (ref 0.35–5.50)

## 2022-07-28 LAB — PSA: PSA: 0.52 ng/mL (ref 0.10–4.00)

## 2022-07-28 MED ORDER — SEMAGLUTIDE-WEIGHT MANAGEMENT 0.5 MG/0.5ML ~~LOC~~ SOAJ: 0.5000 mg | SUBCUTANEOUS | 0 refills | Status: DC

## 2022-07-28 MED ORDER — SEMAGLUTIDE-WEIGHT MANAGEMENT 0.25 MG/0.5ML ~~LOC~~ SOAJ: 0.2500 mg | SUBCUTANEOUS | 0 refills | Status: AC

## 2022-07-28 MED ORDER — OMEGA-3-ACID ETHYL ESTERS 1 G PO CAPS: 2.0000 g | ORAL_CAPSULE | Freq: Two times a day (BID) | ORAL | 1 refills | Status: DC

## 2022-07-28 MED ORDER — SEMAGLUTIDE-WEIGHT MANAGEMENT 1.7 MG/0.75ML ~~LOC~~ SOAJ: 1.7000 mg | SUBCUTANEOUS | 0 refills | Status: DC

## 2022-07-28 MED ORDER — SEMAGLUTIDE-WEIGHT MANAGEMENT 1 MG/0.5ML ~~LOC~~ SOAJ: 1.0000 mg | SUBCUTANEOUS | 0 refills | Status: DC

## 2022-07-28 NOTE — Telephone Encounter (Signed)
Key: [id number]  Approved Start Date:06/28/2022;Coverage End Date:07/28/2023;

## 2022-08-01 ENCOUNTER — Encounter: Payer: Self-pay | Admitting: Internal Medicine

## 2022-08-01 ENCOUNTER — Ambulatory Visit (INDEPENDENT_AMBULATORY_CARE_PROVIDER_SITE_OTHER): Payer: BC Managed Care – PPO | Admitting: Internal Medicine

## 2022-08-01 ENCOUNTER — Ambulatory Visit (INDEPENDENT_AMBULATORY_CARE_PROVIDER_SITE_OTHER): Payer: BC Managed Care – PPO

## 2022-08-01 VITALS — BP 136/80 | HR 57 | Temp 97.7°F | Ht 70.0 in | Wt 192.0 lb

## 2022-08-01 DIAGNOSIS — I10 Essential (primary) hypertension: Secondary | ICD-10-CM | POA: Diagnosis not present

## 2022-08-01 DIAGNOSIS — E291 Testicular hypofunction: Secondary | ICD-10-CM | POA: Diagnosis not present

## 2022-08-01 DIAGNOSIS — R052 Subacute cough: Secondary | ICD-10-CM | POA: Insufficient documentation

## 2022-08-01 DIAGNOSIS — E781 Pure hyperglyceridemia: Secondary | ICD-10-CM

## 2022-08-01 DIAGNOSIS — R059 Cough, unspecified: Secondary | ICD-10-CM | POA: Diagnosis not present

## 2022-08-01 LAB — POC COVID19 BINAXNOW: SARS Coronavirus 2 Ag: NEGATIVE

## 2022-08-01 LAB — TRIGLYCERIDES: Triglycerides: 568 mg/dL — ABNORMAL HIGH (ref 0.0–149.0)

## 2022-08-01 NOTE — Patient Instructions (Signed)

## 2022-08-01 NOTE — Progress Notes (Signed)
Subjective:  Patient ID: Michael Miller, male    DOB: 07-06-1966  Age: 57 y.o. MRN: [id number]  CC: Hypertension and Hyperlipidemia   HPI Michael Miller presents for f/up -  He complains of a 4-day history of nonproductive cough, sore throat, and postnasal drip.  Outpatient Medications Prior to Visit  Medication Sig Dispense Refill   allopurinol (ZYLOPRIM) 300 MG tablet TAKE 1 TABLET DAILY 90 tablet 1   busPIRone (BUSPAR) 10 MG tablet TAKE 1 TABLET TWICE A DAY 180 tablet 1   clonazePAM (KLONOPIN) 0.5 MG tablet TAKE 1 TABLET BY MOUTH EVERY DAY AS NEEDED FOR ANXIETY 30 tablet 2   finasteride (PROPECIA) 1 MG tablet Take 1 tablet (1 mg total) by mouth daily. 90 tablet 1   fluticasone (FLONASE) 50 MCG/ACT nasal spray Place 1 spray into both nostrils as needed.     olmesartan (BENICAR) 20 MG tablet TAKE 1 TABLET DAILY 90 tablet 0   omega-3 acid ethyl esters (LOVAZA) 1 g capsule Take 2 capsules (2 g total) by mouth 2 (two) times daily. 360 capsule 1   Semaglutide-Weight Management 0.25 MG/0.5ML SOAJ Inject 0.25 mg into the skin once a week for 28 days. 2 mL 0   [START ON 08/26/2022] Semaglutide-Weight Management 0.5 MG/0.5ML SOAJ Inject 0.5 mg into the skin once a week for 28 days. 2 mL 0   [START ON 09/24/2022] Semaglutide-Weight Management 1 MG/0.5ML SOAJ Inject 1 mg into the skin once a week for 28 days. 2 mL 0   [START ON 10/23/2022] Semaglutide-Weight Management 1.7 MG/0.75ML SOAJ Inject 1.7 mg into the skin once a week for 28 days. 3 mL 0   sertraline (ZOLOFT) 50 MG tablet TAKE 1 TABLET DAILY 90 tablet 1   XYOSTED 75 MG/0.5ML SOAJ Inject 75 mg into the skin once a week. 6 mL 0   No facility-administered medications prior to visit.    ROS Review of Systems  Constitutional:  Negative for chills, diaphoresis, fatigue and fever.  HENT:  Positive for postnasal drip, rhinorrhea and sore throat. Negative for facial swelling, sinus pain and trouble swallowing.   Respiratory:  Positive for cough.  Negative for chest tightness, shortness of breath and wheezing.   Cardiovascular: Negative.  Negative for chest pain, palpitations and leg swelling.  Gastrointestinal: Negative.  Negative for abdominal pain, constipation, diarrhea, nausea and vomiting.  Endocrine: Negative.   Genitourinary: Negative.   Musculoskeletal:  Positive for arthralgias. Negative for myalgias.  Skin: Negative.  Negative for color change and rash.  Neurological:  Negative for dizziness, weakness, light-headedness and headaches.  Hematological:  Negative for adenopathy. Does not bruise/bleed easily.  Psychiatric/Behavioral: Negative.      Objective:  BP 136/80 (BP Location: Left Arm, Patient Position: Sitting, Cuff Size: Large)   Pulse (!) 57   Temp 97.7 F (36.5 C) (Temporal)   Ht '5\' 10"'$  (1.778 m)   Wt 192 lb (87.1 kg)   SpO2 98%   BMI 27.55 kg/m   BP Readings from Last 3 Encounters:  08/01/22 136/80  07/27/22 132/84  03/30/22 136/80    Wt Readings from Last 3 Encounters:  08/01/22 192 lb (87.1 kg)  07/27/22 195 lb (88.5 kg)  03/30/22 195 lb (88.5 kg)    Physical Exam Vitals reviewed.  Constitutional:      Appearance: Normal appearance. He is not ill-appearing.  HENT:     Nose: Nose normal.     Mouth/Throat:     Mouth: Mucous membranes are moist.  Eyes:  General: No scleral icterus.    Conjunctiva/sclera: Conjunctivae normal.  Cardiovascular:     Rate and Rhythm: Normal rate and regular rhythm.     Heart sounds: No murmur heard. Pulmonary:     Effort: Pulmonary effort is normal.     Breath sounds: No stridor. No wheezing, rhonchi or rales.  Abdominal:     General: Abdomen is flat.     Palpations: There is no mass.     Tenderness: There is no abdominal tenderness. There is no guarding.     Hernia: No hernia is present.  Musculoskeletal:        General: Normal range of motion.     Cervical back: Neck supple.     Right lower leg: No edema.     Left lower leg: No edema.   Lymphadenopathy:     Cervical: No cervical adenopathy.  Skin:    General: Skin is warm and dry.  Neurological:     General: No focal deficit present.     Mental Status: He is alert.  Psychiatric:        Mood and Affect: Mood normal.        Behavior: Behavior normal.     Lab Results  Component Value Date   WBC 8.2 07/27/2022   HGB 17.3 (H) 07/27/2022   HCT 50.5 07/27/2022   PLT 272.0 07/27/2022   GLUCOSE 85 07/27/2022   CHOL 234 (H) 07/27/2022   TRIG 568.0 (H) 08/01/2022   HDL 42.40 07/27/2022   LDLDIRECT 100.0 07/27/2022   LDLCALC 87 03/23/2021   ALT 29 07/27/2022   AST 28 07/27/2022   NA 138 07/27/2022   K 3.9 07/27/2022   CL 100 07/27/2022   CREATININE 1.17 07/27/2022   BUN 20 07/27/2022   CO2 29 07/27/2022   TSH 2.58 07/27/2022   PSA 0.52 07/27/2022   HGBA1C 5.3 10/30/2019    CT CARDIAC SCORING (SELF PAY ONLY)  Addendum Date: 11/04/2021   ADDENDUM REPORT: 11/04/2021 10:07 CLINICAL DATA:  Cardiovascular Disease Risk stratification EXAM: Coronary Calcium Score TECHNIQUE: A gated, non-contrast computed tomography scan of the heart was performed using 37m slice thickness. Axial images were analyzed on a dedicated workstation. Calcium scoring of the coronary arteries was performed using the Agatston method. FINDINGS: Coronary arteries: Normal origins. Coronary Calcium Score: Left main: 0 Left anterior descending artery: 57 Left circumflex artery: 76 Right coronary artery: 0 Total: 133 Percentile: 81 Pericardium: Normal. Aorta: Borderline dilated caliber of ascending aorta. Aortic atherosclerosis noted. Mild aortic valve calcification. [facility]-cardiac: [facility]. IMPRESSION: Coronary calcium score of 133. This was 81st percentile for age-, race-, and sex-matched controls. RECOMMENDATIONS: Coronary artery calcium (CAC) score is a strong predictor of incident coronary heart disease (CHD) and provides predictive information beyond traditional risk  factors. CAC scoring is reasonable to use in the decision to withhold, postpone, or initiate statin therapy in intermediate-risk or selected borderline-risk asymptomatic adults (age 58-75years and LDL-C >=70 to <190 mg/dL) who do not have diabetes or established atherosclerotic cardiovascular disease (ASCVD).* In intermediate-risk (10-year ASCVD risk >=7.5% to <20%) adults or selected borderline-risk (10-year ASCVD risk >=5% to <7.5%) adults in whom a CAC score is measured for the purpose of making a treatment decision the following recommendations have been made: If CAC=0, it is reasonable to withhold statin therapy and reassess in 5 to 10 years, as long as higher risk conditions are absent (diabetes mellitus, family history of premature CHD in first degree relatives (males <55  years; females <65 years), cigarette smoking, or LDL >=190 mg/dL). If CAC is 1 to 99, it is reasonable to initiate statin therapy for patients >=29 years of age. If CAC is >=100 or >=75th percentile, it is reasonable to initiate statin therapy at any age. Cardiology referral should be considered for patients with CAC scores >=400 or >=75th percentile. *2018 AHA/ACC/AACVPR/AAPA/ABC/ACPM/ADA/AGS/APhA/ASPC/NLA/PCNA Guideline on the Management of Blood Cholesterol: A Report of the American College of Cardiology/American Heart Association Task Force on Clinical Practice Guidelines. J Am Coll Cardiol. 2019;73(24):3168-3209. Buford Dresser, MD Electronically Signed   By: Buford Dresser M.D.   On: 11/04/2021 10:07   Result Date: 11/04/2021 EXAM: OVER-READ INTERPRETATION  CT CHEST The following report is an over-read performed by radiologist Dr. Aletta Edouard of [facility], [place] on 11/01/2021. This over-read does not include interpretation of cardiac or coronary anatomy or pathology. The coronary calcium score interpretation by the cardiologist is attached. COMPARISON:  None. FINDINGS: Vascular: The ascending thoracic aorta is  top-normal in caliber measuring up to approximately 3.9 cm. Mediastinum/Nodes: Visualized mediastinum and hilar regions demonstrate no lymphadenopathy or masses. Lungs/Pleura: Visualized lungs show no evidence of pulmonary edema, consolidation, pneumothorax, nodule or pleural fluid. Upper Abdomen: No acute abnormality. Musculoskeletal: No chest wall mass or suspicious bone lesions identified. IMPRESSION: Top-normal caliber of the ascending thoracic aorta measuring up to approximately 3.9 cm. Electronically Signed: By: Aletta Edouard M.D. On: 11/01/2021 16:44   No results found.   Assessment & Plan:   Nehal was seen today for hypertension and hyperlipidemia.  Diagnoses and all orders for this visit:  Hypertriglyceridemia - This has improved. -     Triglycerides; Future -     Triglycerides -     Amb Referral to Nutrition and Diabetic Education  Hypogonadism male- His T level is in the normal range. -     Testosterone Total,Free,Bio, Males; Future -     Testosterone Total,Free,Bio, Males  Subacute cough- Chest x-ray is negative for mass or infiltrate.  COVID is negative.  This is a viral URI. -     DG Chest 2 View; Future -     POC COVID-19  Primary hypertension- His blood pressure is adequately well-controlled.   I am having [name] maintain his fluticasone, olmesartan, busPIRone, allopurinol, Xyosted, sertraline, clonazePAM, finasteride, omega-3 acid ethyl esters, Semaglutide-Weight Management, Semaglutide-Weight Management, Semaglutide-Weight Management, and Semaglutide-Weight Management.  No orders of the defined types were placed in this encounter.    Follow-up: Return if symptoms worsen or fail to improve.  Scarlette Calico, MD

## 2022-08-02 LAB — TESTOSTERONE TOTAL,FREE,BIO, MALES
Albumin: 4.6 g/dL (ref 3.6–5.1)
Sex Hormone Binding: 28 nmol/L (ref 22–77)
Testosterone, Bioavailable: 221.1 ng/dL (ref 110.0–575.0)
Testosterone, Free: 105.3 pg/mL (ref 46.0–224.0)
Testosterone: 653 ng/dL (ref 250–827)

## 2022-08-04 ENCOUNTER — Encounter: Payer: Self-pay | Admitting: Internal Medicine

## 2022-08-09 ENCOUNTER — Encounter: Payer: Self-pay | Admitting: Internal Medicine

## 2022-08-09 ENCOUNTER — Other Ambulatory Visit: Payer: Self-pay | Admitting: Internal Medicine

## 2022-08-09 DIAGNOSIS — E291 Testicular hypofunction: Secondary | ICD-10-CM

## 2022-08-09 DIAGNOSIS — Z1211 Encounter for screening for malignant neoplasm of colon: Secondary | ICD-10-CM | POA: Diagnosis not present

## 2022-08-09 MED ORDER — XYOSTED 75 MG/0.5ML ~~LOC~~ SOAJ: 75.0000 mg | SUBCUTANEOUS | 0 refills | Status: DC

## 2022-08-15 ENCOUNTER — Encounter: Payer: Self-pay | Admitting: Internal Medicine

## 2022-08-15 ENCOUNTER — Ambulatory Visit: Payer: BC Managed Care – PPO | Admitting: Internal Medicine

## 2022-08-18 ENCOUNTER — Other Ambulatory Visit (HOSPITAL_COMMUNITY): Payer: Self-pay

## 2022-08-18 NOTE — Telephone Encounter (Signed)
Pharmacy Patient Advocate Encounter  Prior Authorization for [facility] 0.'25MG'$ /0.5ML has been approved.    PA# [id number] Effective dates: 07/19/2022 through 0/16/2024

## 2022-08-21 ENCOUNTER — Ambulatory Visit: Payer: BC Managed Care – PPO | Admitting: Internal Medicine

## 2022-08-23 ENCOUNTER — Other Ambulatory Visit: Payer: Self-pay | Admitting: Internal Medicine

## 2022-08-23 ENCOUNTER — Telehealth: Payer: Self-pay | Admitting: Internal Medicine

## 2022-08-23 DIAGNOSIS — F411 Generalized anxiety disorder: Secondary | ICD-10-CM

## 2022-08-23 DIAGNOSIS — M1A09X0 Idiopathic chronic gout, multiple sites, without tophus (tophi): Secondary | ICD-10-CM

## 2022-08-23 MED ORDER — ALLOPURINOL 300 MG PO TABS: 300.0000 mg | ORAL_TABLET | Freq: Every day | ORAL | 1 refills | Status: DC

## 2022-08-23 MED ORDER — BUSPIRONE HCL 10 MG PO TABS: 10.0000 mg | ORAL_TABLET | Freq: Two times a day (BID) | ORAL | 1 refills | Status: DC

## 2022-08-23 NOTE — Telephone Encounter (Signed)
[facility] and said patient would like to get a 90d/s of this medication  MEDICATION:  allopurinol (ZYLOPRIM) 300 MG tablet  busPIRone (BUSPAR) 10 MG tablet    PHARMACY:  [place], [place] - [place]    Comments: Request 90d/s  **Let patient know to contact pharmacy at the end of the day to make sure medication is ready. **  ** Please notify patient to allow 48-72 hours to process**  **Encourage patient to contact the pharmacy for refills or they can request refills through [facility]**

## 2022-08-25 DIAGNOSIS — M13841 Other specified arthritis, right hand: Secondary | ICD-10-CM | POA: Diagnosis not present

## 2022-08-25 DIAGNOSIS — Z4789 Encounter for other orthopedic aftercare: Secondary | ICD-10-CM | POA: Diagnosis not present

## 2022-08-29 ENCOUNTER — Other Ambulatory Visit: Payer: Self-pay | Admitting: Internal Medicine

## 2022-08-29 DIAGNOSIS — E785 Hyperlipidemia, unspecified: Secondary | ICD-10-CM

## 2022-08-31 ENCOUNTER — Other Ambulatory Visit: Payer: Self-pay | Admitting: Internal Medicine

## 2022-08-31 DIAGNOSIS — I10 Essential (primary) hypertension: Secondary | ICD-10-CM

## 2022-09-01 ENCOUNTER — Other Ambulatory Visit: Payer: Self-pay | Admitting: Internal Medicine

## 2022-09-01 DIAGNOSIS — F411 Generalized anxiety disorder: Secondary | ICD-10-CM

## 2022-09-07 ENCOUNTER — Ambulatory Visit: Payer: BC Managed Care – PPO | Admitting: Dietician

## 2022-09-12 ENCOUNTER — Encounter: Payer: Self-pay | Admitting: Internal Medicine

## 2022-09-12 ENCOUNTER — Ambulatory Visit (INDEPENDENT_AMBULATORY_CARE_PROVIDER_SITE_OTHER): Payer: BC Managed Care – PPO | Admitting: Cardiology

## 2022-09-12 ENCOUNTER — Encounter (HOSPITAL_BASED_OUTPATIENT_CLINIC_OR_DEPARTMENT_OTHER): Payer: Self-pay | Admitting: Cardiology

## 2022-09-12 VITALS — BP 110/72 | HR 92 | Ht 70.0 in | Wt 196.7 lb

## 2022-09-12 DIAGNOSIS — Z8249 Family history of ischemic heart disease and other diseases of the circulatory system: Secondary | ICD-10-CM | POA: Diagnosis not present

## 2022-09-12 DIAGNOSIS — E782 Mixed hyperlipidemia: Secondary | ICD-10-CM | POA: Diagnosis not present

## 2022-09-12 DIAGNOSIS — I4510 Unspecified right bundle-branch block: Secondary | ICD-10-CM | POA: Diagnosis not present

## 2022-09-12 DIAGNOSIS — I10 Essential (primary) hypertension: Secondary | ICD-10-CM | POA: Diagnosis not present

## 2022-09-12 DIAGNOSIS — Z7189 Other specified counseling: Secondary | ICD-10-CM

## 2022-09-12 NOTE — Progress Notes (Signed)
Cardiology Office Note:    Date:  09/12/2022   ID:  Michael Miller, DOB 10-20-1965, MRN [id number]  PCP:  [name], MD  Cardiologist:  Buford Dresser, MD  Referring MD: [name], MD   CC: follow up  History of Present Illness:    Michael Miller is a 57 y.o. male with a hx of hypertension, hyperlipidemia, hypothyroidism, and depression, who is seen for follow up today. 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  CV history of RBBB and LAFB that was previously evaluated by a cardiologist in [place] years ago. Hypertension >10 years, hyperlipidemia ~7-8 years ago. Former smoker. Smoked for about 20 years. Drinks socially, a couple cocktails or wine in a given night. Family history: His father had aortic valve replacement around 58 yo, CABG x3. His brother had a stent placement at 54 yo.   Today: Overall doing well, but concerned about his recent triglyceride numbers.  07/27/22:TG 737: not fasting, had just returned from a friend's birthday party with lots of alcohol and [place] food, was off of atorvastatin at the time 08/01/22: TG 568, was fasting 12-16 hours prior. Not certain when he restarted the atorvastatin.  Has cut his alcohol way down in January. Works out 45-60 minutes daily, no recent changes to his max intensity level. Back on statin for at least 3-4 weeks. Has been on lovaza for over a month as well.  Denies chest pain, shortness of breath at rest or with normal exertion. No PND, orthopnea, LE edema or unexpected weight gain. No syncope or palpitations.   Past Medical History:  Diagnosis Date   Depression    Hypercholesteremia    Hypertension    Hypothyroid     Past Surgical History:  Procedure Laterality Date   ADENOIDECTOMY  1971   ARTHRODESIS N/A 08/08/2021   Thumb Bone Fusion   HERNIA REPAIR N/A 2016   Hernioplasty   KNEE ARTHROSCOPY Left 2012   SHOULDER  ARTHROSCOPY Bilateral 2014   Left and Right   TONSILLECTOMY  1971    Current Medications: Current Outpatient Medications on File Prior to Visit  Medication Sig   allopurinol (ZYLOPRIM) 300 MG tablet Take 1 tablet (300 mg total) by mouth daily.   atorvastatin (LIPITOR) 20 MG tablet Take 20 mg by mouth daily.   busPIRone (BUSPAR) 10 MG tablet Take 1 tablet (10 mg total) by mouth 2 (two) times daily.   clonazePAM (KLONOPIN) 0.5 MG tablet TAKE 1 TABLET BY MOUTH EVERY DAY AS NEEDED FOR ANXIETY   finasteride (PROPECIA) 1 MG tablet Take 1 tablet (1 mg total) by mouth daily.   olmesartan (BENICAR) 20 MG tablet TAKE 1 TABLET DAILY   omega-3 acid ethyl esters (LOVAZA) 1 g capsule Take 2 capsules (2 g total) by mouth 2 (two) times daily.   sertraline (ZOLOFT) 50 MG tablet TAKE 1 TABLET DAILY   XYOSTED 75 MG/0.5ML SOAJ Inject 75 mg into the skin once a week.   No current facility-administered medications on file prior to visit.     Allergies:   Patient has no known allergies.   Social History   Tobacco Use   Smoking status: Former   Smokeless tobacco: Never  Substance Use Topics   Alcohol use: Yes    Alcohol/week: 12.0 standard drinks of alcohol    Types: 12 Glasses of wine per week   Drug use: Never  Family History: family history includes CAD in his brother and father; Hypertension in his brother.  ROS:   Please see the history of present illness.  Additional pertinent ROS otherwise unremarkable.  EKGs/Labs/Other Studies Reviewed:    The following studies were reviewed today:  Calcium score 11/01/21 Coronary Calcium Score:   Left main: 0  Left anterior descending artery: 57  Left circumflex artery: 76  Right coronary artery: 0   Total: 133  Percentile: 81   Aorta: Borderline dilated caliber of ascending aorta. Aortic atherosclerosis noted. Mild aortic valve calcification.   IMPRESSION: Coronary calcium score of 133. This was 81st percentile for age-, race-, and  sex-matched controls.  EKG:  EKG is personally reviewed.   09/12/22: NSR at 92 bpm, RBBB, LAFB 09/12/2021: NSR at 88 bpm, RBBB, LAFB  Recent Labs: 07/27/2022: ALT 29; BUN 20; Creatinine, Ser 1.17; Hemoglobin 17.3; Platelets 272.0; Potassium 3.9; Sodium 138; TSH 2.58   Recent Lipid Panel    Component Value Date/Time   CHOL 234 (H) 07/27/2022 1507   TRIG 568.0 (H) 08/01/2022 0845   HDL 42.40 07/27/2022 1507   CHOLHDL 6 07/27/2022 1507   VLDL 37.0 03/23/2021 1044   LDLCALC 87 03/23/2021 1044   LDLDIRECT 100.0 07/27/2022 1507    Physical Exam:    VS:  BP 110/72 (BP Location: Left Arm, Patient Position: Sitting, Cuff Size: Large)   Pulse 92   Ht 5' 10"$  (1.778 m)   Wt 196 lb 11.2 oz (89.2 kg)   BMI 28.22 kg/m     Wt Readings from Last 3 Encounters:  09/12/22 196 lb 11.2 oz (89.2 kg)  08/01/22 192 lb (87.1 kg)  07/27/22 195 lb (88.5 kg)    GEN: Well nourished, well developed in no acute distress HEENT: Normal, moist mucous membranes NECK: No JVD CARDIAC: regular rhythm, normal S1 and S2, no rubs or gallops. No murmur. VASCULAR: Radial and DP pulses 2+ bilaterally. No carotid bruits RESPIRATORY:  Clear to auscultation without rales, wheezing or rhonchi  ABDOMEN: Soft, non-tender, non-distended MUSCULOSKELETAL:  Ambulates independently SKIN: Warm and dry, no edema NEUROLOGIC:  Alert and oriented x 3. No focal neuro deficits noted. PSYCHIATRIC:  Normal affect    ASSESSMENT:    1. Essential hypertension   2. Mixed hyperlipidemia   3. Family history of early CAD   46. RBBB   5. Cardiac risk counseling     PLAN:    Family history of heart disease Hypercholesterolemia Hypertriglyceridemia Coronary artery calcification -on atorvastatin 20 mg daily -calcium score 133 -recent TG have been very elevated. Corresponded to trial off of atorvastatin (for muscle aches, noticed no change off statin), recent trip with diet/alcohol use out of his norm. Initial was not fasting,  repeat was a few days later with fasting -has since restarted atorvastatin and started lovaza -has been cautious about alcohol, food choices. Remains very active -will recheck fasting lipids in 1 mos to determine if additional treatment needed  Abnormal ECG RBBB LAFB -stable from ECG at Dr. Ronnald Ramp office, first told about this years ago -no high risk features -counseled on red flag warning signs that need immediate medical attention  Hypertension: -at goal today -continue olmesartan  Cardiac risk counseling and prevention recommendations: -recommend heart healthy/Mediterranean diet, with whole grains, fruits, vegetable, fish, lean meats, nuts, and olive oil. Limit salt. -recommend moderate walking, 3-5 times/week for 30-50 minutes each session. Aim for at least 150 minutes.week. Goal should be pace of 3 miles/hours, or walking 1.5 miles in 30  minutes -recommend avoidance of tobacco products. Avoid excess alcohol. -ASCVD risk score: The 10-year ASCVD risk score (Arnett DK, et al., 2019) is: 8.1%   Values used to calculate the score:     Age: 75 years     Sex: Male     Is Non-Hispanic African American: No     Diabetic: No     Tobacco smoker: No     Systolic Blood Pressure: [id number] mmHg     Is BP treated: Yes     HDL Cholesterol: 42.4 mg/dL     Total Cholesterol: 234 mg/dL    Plan for follow up: if TG improves, followup 1 year. If not, will bring back to discuss options.  Buford Dresser, MD, PhD, [facility]    Medication Adjustments/Labs and Tests Ordered: Current medicines are reviewed at length with the patient today.  Concerns regarding medicines are outlined above.   Orders Placed This Encounter  Procedures   Lipid panel   EKG 12-Lead   No orders of the defined types were placed in this encounter.  Patient Instructions  Medication Instructions:  Your physician recommends that you continue on your current medications as directed. Please refer  to the Current Medication list given to you today.   Labwork: Come back and get fasting lipids in 1 month.  Testing/Procedures: NONE  Follow-Up: 1 YEAR WITH DR Harrell Gave   If you need a refill on your cardiac medications before your next appointment, please call your pharmacy.     Signed, Buford Dresser, MD PhD 09/12/2022 5:37 PM    [place]

## 2022-09-12 NOTE — Patient Instructions (Addendum)
Medication Instructions:  Your physician recommends that you continue on your current medications as directed. Please refer to the Current Medication list given to you today.   Labwork: Come back and get fasting lipids in 1 month.  Testing/Procedures: NONE  Follow-Up: 1 YEAR WITH DR Harrell Gave   If you need a refill on your cardiac medications before your next appointment, please call your pharmacy.

## 2022-10-10 DIAGNOSIS — K635 Polyp of colon: Secondary | ICD-10-CM | POA: Diagnosis not present

## 2022-10-10 DIAGNOSIS — Z1211 Encounter for screening for malignant neoplasm of colon: Secondary | ICD-10-CM | POA: Diagnosis not present

## 2022-10-10 LAB — HM COLONOSCOPY

## 2022-10-12 DIAGNOSIS — E7800 Pure hypercholesterolemia, unspecified: Secondary | ICD-10-CM | POA: Diagnosis not present

## 2022-10-12 DIAGNOSIS — I10 Essential (primary) hypertension: Secondary | ICD-10-CM | POA: Diagnosis not present

## 2022-10-13 LAB — LIPID PANEL
Chol/HDL Ratio: 3.7 ratio (ref 0.0–5.0)
Cholesterol, Total: 159 mg/dL (ref 100–199)
HDL: 43 mg/dL (ref >39)
LDL Chol Calc (NIH): 84 mg/dL (ref 0–99)
Triglycerides: 188 mg/dL — ABNORMAL HIGH (ref 0–149)
VLDL Cholesterol Cal: 32 mg/dL (ref 5–40)

## 2022-10-17 ENCOUNTER — Encounter (HOSPITAL_BASED_OUTPATIENT_CLINIC_OR_DEPARTMENT_OTHER): Payer: Self-pay

## 2022-10-17 DIAGNOSIS — L718 Other rosacea: Secondary | ICD-10-CM | POA: Diagnosis not present

## 2022-10-30 ENCOUNTER — Other Ambulatory Visit: Payer: Self-pay | Admitting: Internal Medicine

## 2022-10-30 DIAGNOSIS — F411 Generalized anxiety disorder: Secondary | ICD-10-CM

## 2022-10-30 DIAGNOSIS — M1A09X0 Idiopathic chronic gout, multiple sites, without tophus (tophi): Secondary | ICD-10-CM

## 2022-10-31 ENCOUNTER — Encounter (HOSPITAL_BASED_OUTPATIENT_CLINIC_OR_DEPARTMENT_OTHER): Payer: Self-pay

## 2022-10-31 DIAGNOSIS — E782 Mixed hyperlipidemia: Secondary | ICD-10-CM

## 2022-10-31 DIAGNOSIS — I10 Essential (primary) hypertension: Secondary | ICD-10-CM

## 2022-10-31 MED ORDER — ATORVASTATIN CALCIUM 40 MG PO TABS: 40.0000 mg | ORAL_TABLET | Freq: Every day | ORAL | 3 refills | Status: DC

## 2022-11-15 ENCOUNTER — Other Ambulatory Visit: Payer: Self-pay | Admitting: Internal Medicine

## 2022-11-15 DIAGNOSIS — E291 Testicular hypofunction: Secondary | ICD-10-CM

## 2022-11-15 MED ORDER — XYOSTED 75 MG/0.5ML ~~LOC~~ SOAJ
75.0000 mg | SUBCUTANEOUS | 0 refills | Status: DC
Start: 2022-11-15 — End: 2022-11-15

## 2022-11-15 MED ORDER — XYOSTED 75 MG/0.5ML ~~LOC~~ SOAJ
75.0000 mg | SUBCUTANEOUS | 0 refills | Status: DC
Start: 2022-11-15 — End: 2023-04-11

## 2022-11-16 DIAGNOSIS — H40013 Open angle with borderline findings, low risk, bilateral: Secondary | ICD-10-CM | POA: Diagnosis not present

## 2022-11-16 DIAGNOSIS — H2513 Age-related nuclear cataract, bilateral: Secondary | ICD-10-CM | POA: Diagnosis not present

## 2022-11-16 NOTE — Telephone Encounter (Signed)
I have prescribed this 3 times over the last 2 days

## 2022-11-16 NOTE — Telephone Encounter (Signed)
[facility] spoke w/ rep Dewayne Hatch. Gave her pt information she was able o pull up script she states the med is in process and pt will receive in 7 days. Called pt inform him of status. MD kept getting duplicate request but med was not denied.Marland KitchenSantiago Bumpers

## 2022-11-25 ENCOUNTER — Other Ambulatory Visit: Payer: Self-pay | Admitting: Internal Medicine

## 2022-11-25 DIAGNOSIS — E781 Pure hyperglyceridemia: Secondary | ICD-10-CM

## 2022-11-27 ENCOUNTER — Telehealth (HOSPITAL_BASED_OUTPATIENT_CLINIC_OR_DEPARTMENT_OTHER): Payer: Self-pay

## 2022-11-27 DIAGNOSIS — E782 Mixed hyperlipidemia: Secondary | ICD-10-CM

## 2022-11-27 DIAGNOSIS — G4733 Obstructive sleep apnea (adult) (pediatric): Secondary | ICD-10-CM | POA: Diagnosis not present

## 2022-11-27 MED ORDER — ATORVASTATIN CALCIUM 40 MG PO TABS
40.0000 mg | ORAL_TABLET | Freq: Every day | ORAL | 3 refills | Status: DC
Start: 2022-11-27 — End: 2023-03-14

## 2022-11-27 NOTE — Telephone Encounter (Signed)
Received fax from [facility] requesting refills for Atorvastatin 40 mg. Rx request sent to pharmacy.

## 2022-11-29 ENCOUNTER — Other Ambulatory Visit: Payer: Self-pay | Admitting: Internal Medicine

## 2022-11-29 ENCOUNTER — Encounter: Payer: Self-pay | Admitting: Internal Medicine

## 2022-11-29 DIAGNOSIS — F411 Generalized anxiety disorder: Secondary | ICD-10-CM

## 2022-11-30 ENCOUNTER — Ambulatory Visit (INDEPENDENT_AMBULATORY_CARE_PROVIDER_SITE_OTHER): Payer: BC Managed Care – PPO

## 2022-11-30 ENCOUNTER — Telehealth: Payer: Self-pay

## 2022-11-30 ENCOUNTER — Ambulatory Visit (INDEPENDENT_AMBULATORY_CARE_PROVIDER_SITE_OTHER): Payer: BC Managed Care – PPO | Admitting: Internal Medicine

## 2022-11-30 ENCOUNTER — Encounter: Payer: Self-pay | Admitting: Internal Medicine

## 2022-11-30 VITALS — BP 122/78 | HR 60 | Temp 97.8°F | Resp 16 | Ht 70.0 in | Wt 192.0 lb

## 2022-11-30 DIAGNOSIS — E291 Testicular hypofunction: Secondary | ICD-10-CM | POA: Diagnosis not present

## 2022-11-30 DIAGNOSIS — I10 Essential (primary) hypertension: Secondary | ICD-10-CM | POA: Diagnosis not present

## 2022-11-30 DIAGNOSIS — R059 Cough, unspecified: Secondary | ICD-10-CM | POA: Diagnosis not present

## 2022-11-30 DIAGNOSIS — R052 Subacute cough: Secondary | ICD-10-CM

## 2022-11-30 DIAGNOSIS — R61 Generalized hyperhidrosis: Secondary | ICD-10-CM

## 2022-11-30 DIAGNOSIS — E785 Hyperlipidemia, unspecified: Secondary | ICD-10-CM

## 2022-11-30 LAB — CBC WITH DIFFERENTIAL/PLATELET
Basophils Absolute: 0 10*3/uL (ref 0.0–0.1)
Basophils Relative: 0.4 % (ref 0.0–3.0)
Eosinophils Absolute: 0.2 10*3/uL (ref 0.0–0.7)
Eosinophils Relative: 2.1 % (ref 0.0–5.0)
HCT: 52.2 % — ABNORMAL HIGH (ref 39.0–52.0)
Hemoglobin: 18.1 g/dL (ref 13.0–17.0)
Lymphocytes Relative: 25.7 % (ref 12.0–46.0)
Lymphs Abs: 2.1 10*3/uL (ref 0.7–4.0)
MCHC: 34.7 g/dL (ref 30.0–36.0)
MCV: 90.9 fl (ref 78.0–100.0)
Monocytes Absolute: 0.9 10*3/uL (ref 0.1–1.0)
Monocytes Relative: 11.2 % (ref 3.0–12.0)
Neutro Abs: 4.9 10*3/uL (ref 1.4–7.7)
Neutrophils Relative %: 60.6 % (ref 43.0–77.0)
Platelets: 256 10*3/uL (ref 150.0–400.0)
RBC: 5.75 Mil/uL (ref 4.22–5.81)
RDW: 13.6 % (ref 11.5–15.5)
WBC: 8 10*3/uL (ref 4.0–10.5)

## 2022-11-30 LAB — BASIC METABOLIC PANEL
BUN: 17 mg/dL (ref 6–23)
CO2: 27 mEq/L (ref 19–32)
Calcium: 9.7 mg/dL (ref 8.4–10.5)
Chloride: 101 mEq/L (ref 96–112)
Creatinine, Ser: 1.07 mg/dL (ref 0.40–1.50)
GFR: 76.79 mL/min (ref >60.00)
Glucose, Bld: 103 mg/dL — ABNORMAL HIGH (ref 70–99)
Potassium: 4.2 mEq/L (ref 3.5–5.1)
Sodium: 137 mEq/L (ref 135–145)

## 2022-11-30 LAB — CK: Total CK: 165 U/L (ref 7–232)

## 2022-11-30 NOTE — Patient Instructions (Signed)
Cough, Adult Coughing is a reflex that clears your throat and airways (respiratory system). It helps heal and protect your lungs. It is normal to cough from time to time. A cough that happens with other symptoms or that lasts a long time may be a sign of a condition that needs treatment. A short-term (acute) cough may only last 2-3 weeks. A long-term (chronic) cough may last 8 or more weeks. Coughing is often caused by: Diseases, such as: An infection of the respiratory system. Asthma or other heart or lung diseases. Gastroesophageal reflux. This is when acid comes back up from the stomach. Breathing in things that irritate your lungs. Allergies. Postnasal drip. This is when mucus runs down the back of your throat. Smoking. Some medicines. Follow these instructions at home: Medicines Take over-the-counter and prescription medicines only as told by your health care provider. Talk with your provider before you take cough medicine (cough suppressants). Eating and drinking Do not drink alcohol. Avoid caffeine. Drink enough fluid to keep your pee (urine) pale yellow. Lifestyle Avoid cigarette smoke. Do not use any products that contain nicotine or tobacco. These products include cigarettes, chewing tobacco, and vaping devices, such as e-cigarettes. If you need help quitting, ask your provider. Avoid things that make you cough. These may include perfumes, candles, cleaning products, or campfire smoke. General instructions  Watch for any changes to your cough. Tell your provider about them. Always cover your mouth when you cough. If the air is dry in your bedroom or home, use a cool mist vaporizer or humidifier. If your cough is worse at night, try to sleep in a semi-upright position. Rest as needed. Contact a health care provider if: You have new symptoms, or your symptoms get worse. You cough up pus. You have a fever that does not go away or a cough that does not get better after 2-3  weeks. You cannot control your cough with medicine, and you are losing sleep. You have pain that gets worse or is not helped with medicine. You lose weight for no clear reason. You have night sweats. Get help right away if: You cough up blood. You have trouble breathing. Your heart is beating very fast. These symptoms may be an emergency. Get help right away. Call 911. Do not wait to see if the symptoms will go away. Do not drive yourself to the hospital. This information is not intended to replace advice given to you by your health care provider. Make sure you discuss any questions you have with your health care provider. Document Revised: 03/17/2022 Document Reviewed: 03/17/2022 Elsevier Patient Education  2023 [employer].  

## 2022-11-30 NOTE — Progress Notes (Signed)
Subjective:  Patient ID: Michael Miller, male    DOB: 03-08-1966  Age: 57 y.o. MRN: [id number]  CC: Cough, Hypertension, and Hyperlipidemia   HPI Michael Miller presents for f/up ---  He complains of a 3-week history of nonproductive cough and intermittent night sweats.  He has a burning sensation in his chest but he denies pleuritic discomfort, fever, chills, shortness of breath, diaphoresis, or edema.  Outpatient Medications Prior to Visit  Medication Sig Dispense Refill   allopurinol (ZYLOPRIM) 300 MG tablet TAKE 1 TABLET BY MOUTH EVERY DAY 90 tablet 1   atorvastatin (LIPITOR) 40 MG tablet Take 1 tablet (40 mg total) by mouth daily. 90 tablet 3   busPIRone (BUSPAR) 10 MG tablet TAKE 1 TABLET BY MOUTH TWICE A DAY 180 tablet 1   clonazePAM (KLONOPIN) 0.5 MG tablet TAKE 1 TABLET BY MOUTH EVERY DAY AS NEEDED FOR ANXIETY 30 tablet 2   finasteride (PROPECIA) 1 MG tablet Take 1 tablet (1 mg total) by mouth daily. 90 tablet 1   olmesartan (BENICAR) 20 MG tablet TAKE 1 TABLET DAILY 90 tablet 1   omega-3 acid ethyl esters (LOVAZA) 1 g capsule TAKE 2 CAPSULES BY MOUTH 2 TIMES DAILY. 360 capsule 1   sertraline (ZOLOFT) 50 MG tablet TAKE 1 TABLET DAILY 90 tablet 1   XYOSTED 75 MG/0.5ML SOAJ Inject 75 mg into the skin once a week. 6 mL 0   No facility-administered medications prior to visit.    ROS Review of Systems  Constitutional: Negative.  Negative for chills, diaphoresis, fatigue and fever.  HENT: Negative.  Negative for sore throat and trouble swallowing.   Respiratory:  Positive for cough. Negative for chest tightness, shortness of breath and wheezing.   Cardiovascular:  Negative for chest pain, palpitations and leg swelling.  Gastrointestinal:  Negative for abdominal pain, diarrhea, nausea and vomiting.  Genitourinary: Negative.  Negative for difficulty urinating.  Musculoskeletal: Negative.  Negative for arthralgias and myalgias.  Skin: Negative.  Negative for rash.  Neurological:  Negative.  Negative for dizziness, weakness and light-headedness.  Hematological:  Negative for adenopathy. Does not bruise/bleed easily.  Psychiatric/Behavioral:  Negative for behavioral problems, confusion, decreased concentration, dysphoric mood, sleep disturbance and suicidal ideas. The patient is nervous/anxious.     Objective:  BP 122/78 (BP Location: Left Arm, Patient Position: Sitting, Cuff Size: Large)   Pulse 60   Temp 97.8 F (36.6 C) (Oral)   Resp 16   Ht 5\' 10"  (1.778 m)   Wt 192 lb (87.1 kg)   SpO2 98%   BMI 27.55 kg/m   BP Readings from Last 3 Encounters:  11/30/22 122/78  09/12/22 110/72  08/01/22 136/80    Wt Readings from Last 3 Encounters:  11/30/22 192 lb (87.1 kg)  09/12/22 196 lb 11.2 oz (89.2 kg)  08/01/22 192 lb (87.1 kg)    Physical Exam Vitals reviewed.  Constitutional:      Appearance: Normal appearance.  HENT:     Nose: Nose normal.     Mouth/Throat:     Mouth: Mucous membranes are moist.  Eyes:     General: No scleral icterus.    Conjunctiva/sclera: Conjunctivae normal.  Cardiovascular:     Rate and Rhythm: Normal rate and regular rhythm.     Heart sounds: No murmur heard. Pulmonary:     Effort: Pulmonary effort is normal.     Breath sounds: No stridor. No wheezing, rhonchi or rales.  Abdominal:     General: Abdomen is flat.  Palpations: There is no mass.     Tenderness: There is no abdominal tenderness. There is no guarding.     Hernia: No hernia is present.  Musculoskeletal:        General: Normal range of motion.     Cervical back: Neck supple.     Right lower leg: No edema.     Left lower leg: No edema.  Lymphadenopathy:     Cervical: No cervical adenopathy.  Skin:    General: Skin is warm and dry.     Coloration: Skin is not pale.     Findings: No rash.  Neurological:     General: No focal deficit present.     Mental Status: He is alert. Mental status is at baseline.  Psychiatric:        Mood and Affect: Mood  normal.        Behavior: Behavior normal.     Lab Results  Component Value Date   WBC 8.0 11/30/2022   HGB 18.1 Repeated and verified X2. (HH) 11/30/2022   HCT 52.2 (H) 11/30/2022   PLT 256.0 11/30/2022   GLUCOSE 103 (H) 11/30/2022   CHOL 159 10/12/2022   TRIG 188 (H) 10/12/2022   HDL 43 10/12/2022   LDLDIRECT 100.0 07/27/2022   LDLCALC 84 10/12/2022   ALT 29 07/27/2022   AST 28 07/27/2022   NA 137 11/30/2022   K 4.2 11/30/2022   CL 101 11/30/2022   CREATININE 1.07 11/30/2022   BUN 17 11/30/2022   CO2 27 11/30/2022   TSH 2.58 07/27/2022   PSA 0.52 07/27/2022   HGBA1C 5.3 10/30/2019    CT CARDIAC SCORING (SELF PAY ONLY)  Addendum Date: 11/04/2021   ADDENDUM REPORT: 11/04/2021 10:07 CLINICAL DATA:  Cardiovascular Disease Risk stratification EXAM: Coronary Calcium Score TECHNIQUE: A gated, non-contrast computed tomography scan of the heart was performed using 3mm slice thickness. Axial images were analyzed on a dedicated workstation. Calcium scoring of the coronary arteries was performed using the Agatston method. FINDINGS: Coronary arteries: Normal origins. Coronary Calcium Score: Left main: 0 Left anterior descending artery: 57 Left circumflex artery: 76 Right coronary artery: 0 Total: 133 Percentile: 81 Pericardium: Normal. Aorta: Borderline dilated caliber of ascending aorta. Aortic atherosclerosis noted. Mild aortic valve calcification. Non-cardiac: See separate report from [facility]. IMPRESSION: Coronary calcium score of 133. This was 81st percentile for age-, race-, and sex-matched controls. RECOMMENDATIONS: Coronary artery calcium (CAC) score is a strong predictor of incident coronary heart disease (CHD) and provides predictive information beyond traditional risk factors. CAC scoring is reasonable to use in the decision to withhold, postpone, or initiate statin therapy in intermediate-risk or selected borderline-risk asymptomatic adults (age 36-75 years and LDL-C >=70  to <190 mg/dL) who do not have diabetes or established atherosclerotic cardiovascular disease (ASCVD).* In intermediate-risk (10-year ASCVD risk >=7.5% to <20%) adults or selected borderline-risk (10-year ASCVD risk >=5% to <7.5%) adults in whom a CAC score is measured for the purpose of making a treatment decision the following recommendations have been made: If CAC=0, it is reasonable to withhold statin therapy and reassess in 5 to 10 years, as long as higher risk conditions are absent (diabetes mellitus, family history of premature CHD in first degree relatives (males <55 years; females <65 years), cigarette smoking, or LDL >=190 mg/dL). If CAC is 1 to 99, it is reasonable to initiate statin therapy for patients >=15 years of age. If CAC is >=100 or >=75th percentile, it is reasonable to initiate statin therapy at  any age. Cardiology referral should be considered for patients with CAC scores >=400 or >=75th percentile. *2018 AHA/ACC/AACVPR/AAPA/ABC/ACPM/ADA/AGS/APhA/ASPC/NLA/PCNA Guideline on the Management of Blood Cholesterol: A Report of the American College of Cardiology/American Heart Association Task Force on Clinical Practice Guidelines. J Am Coll Cardiol. 2019;73(24):3168-3209. Jodelle Red, MD Electronically Signed   By: Jodelle Red M.D.   On: 11/04/2021 10:07   Result Date: 11/04/2021 EXAM: OVER-READ INTERPRETATION  CT CHEST The following report is an over-read performed by radiologist Dr. Irish Lack of [facility] on 11/01/2021. This over-read does not include interpretation of cardiac or coronary anatomy or pathology. The coronary calcium score interpretation by the cardiologist is attached. COMPARISON:  None. FINDINGS: Vascular: The ascending thoracic aorta is top-normal in caliber measuring up to approximately 3.9 cm. Mediastinum/Nodes: Visualized mediastinum and hilar regions demonstrate no lymphadenopathy or masses. Lungs/Pleura: Visualized lungs show no  evidence of pulmonary edema, consolidation, pneumothorax, nodule or pleural fluid. Upper Abdomen: No acute abnormality. Musculoskeletal: No chest wall mass or suspicious bone lesions identified. IMPRESSION: Top-normal caliber of the ascending thoracic aorta measuring up to approximately 3.9 cm. Electronically Signed: By: Irish Lack M.D. On: 11/01/2021 16:44   DG Chest 2 View  Result Date: 11/30/2022 CLINICAL DATA:  Provided history: Subacute cough.  Night sweats. EXAM: CHEST - 2 VIEW COMPARISON:  Radiographs 08/01/2022 and earlier. FINDINGS: Heart size within normal limits. No appreciable airspace consolidation. No evidence of pleural effusion or pneumothorax. No acute osseous abnormality identified. IMPRESSION: No evidence of active cardiopulmonary disease. Electronically Signed   By: Jackey Loge D.O.   On: 11/30/2022 10:22     Assessment & Plan:   Subacute cough- Chest x-ray is negative for infection. -     DG Chest 2 View; Future  Night sweats -     DG Chest 2 View; Future  Hypogonadism male- H&H are mildly elevated.  I recommended that he donate blood.  Will continue to monitor and may decrease his T dosage. -     CBC with Differential/Platelet; Future  Hyperlipidemia with target LDL less than 130- LDL goal achieved. Doing well on the statin  -     CK; Future  Primary hypertension- His blood pressure is over-controlled.  Will discontinue the ARB and continue to monitor. -     CBC with Differential/Platelet; Future -     Basic metabolic panel; Future     Follow-up: Return in about 6 months (around 06/02/2023).  Sanda Linger, MD

## 2022-12-05 ENCOUNTER — Other Ambulatory Visit: Payer: Self-pay | Admitting: Internal Medicine

## 2022-12-05 DIAGNOSIS — H02413 Mechanical ptosis of bilateral eyelids: Secondary | ICD-10-CM | POA: Diagnosis not present

## 2022-12-05 DIAGNOSIS — H02423 Myogenic ptosis of bilateral eyelids: Secondary | ICD-10-CM | POA: Diagnosis not present

## 2022-12-05 DIAGNOSIS — L659 Nonscarring hair loss, unspecified: Secondary | ICD-10-CM

## 2022-12-05 DIAGNOSIS — H02834 Dermatochalasis of left upper eyelid: Secondary | ICD-10-CM | POA: Diagnosis not present

## 2022-12-05 DIAGNOSIS — H02831 Dermatochalasis of right upper eyelid: Secondary | ICD-10-CM | POA: Diagnosis not present

## 2023-01-21 ENCOUNTER — Other Ambulatory Visit: Payer: Self-pay | Admitting: Internal Medicine

## 2023-01-21 DIAGNOSIS — F411 Generalized anxiety disorder: Secondary | ICD-10-CM

## 2023-02-09 ENCOUNTER — Ambulatory Visit (INDEPENDENT_AMBULATORY_CARE_PROVIDER_SITE_OTHER): Payer: BC Managed Care – PPO | Admitting: Nurse Practitioner

## 2023-02-09 VITALS — BP 118/82 | HR 83 | Temp 98.3°F | Ht 70.0 in | Wt 192.4 lb

## 2023-02-09 DIAGNOSIS — H60501 Unspecified acute noninfective otitis externa, right ear: Secondary | ICD-10-CM | POA: Insufficient documentation

## 2023-02-09 MED ORDER — CIPROFLOXACIN-DEXAMETHASONE 0.3-0.1 % OT SUSP
4.0000 [drp] | Freq: Two times a day (BID) | OTIC | 0 refills | Status: DC
Start: 2023-02-09 — End: 2023-06-22

## 2023-02-09 MED ORDER — AMOXICILLIN-POT CLAVULANATE 875-125 MG PO TABS: 1.0000 | ORAL_TABLET | Freq: Two times a day (BID) | ORAL | 0 refills | Status: DC

## 2023-02-09 NOTE — Assessment & Plan Note (Signed)
Acute Scant serous fluid located behind right eardrum, no evidence of infection.  Tragus is tender, concern for possible early otitis externa.  Because patient is repairing to travel will treat with Ciprodex 4 drops to right ear BID x 5-7 days.  Will also prescribe him course of Augmentin that he can take with him on his trip in the event his middle ear effusion progressions to infection while he is away.  Patient reports understanding.

## 2023-02-09 NOTE — Progress Notes (Signed)
   Established Patient Office Visit  Subjective   Patient ID: Michael Miller, male    DOB: [birth year]  Age: 57 y.o. MRN: [id number]  Chief Complaint  Patient presents with   Ear Pain    Pain come and goes, when chewing. It has cause him to wake up from sleep and also it been going on for three days     Sx onset about 3 days ago. Located to right ear. Some tenderness with chewing.  Patient is preparing to travel out of the country to [place] in the near future.  Wanted to have ear evaluated in case he has an infection to treatment for traveling.    Review of Systems  Constitutional:  Negative for fever.  HENT:  Positive for ear pain. Negative for ear discharge, hearing loss and sinus pain.       Objective:     BP 118/82   Pulse 83   Temp 98.3 F (36.8 C) (Temporal)   Ht 5\' 10"  (1.778 m)   Wt 192 lb 6 oz (87.3 kg)   SpO2 94%   BMI 27.60 kg/m    Physical Exam Vitals reviewed.  Constitutional:      Appearance: Normal appearance.  HENT:     Head: Normocephalic and atraumatic.     Right Ear: Hearing, ear canal and external ear normal. A middle ear effusion is present.     Ears:     Comments: Mild tenderness of right tragus Cardiovascular:     Rate and Rhythm: Normal rate and regular rhythm.  Pulmonary:     Effort: Pulmonary effort is normal.     Breath sounds: Normal breath sounds.  Musculoskeletal:     Cervical back: Neck supple.  Skin:    General: Skin is warm and dry.  Neurological:     Mental Status: He is alert and oriented to person, place, and time.  Psychiatric:        Mood and Affect: Mood normal.        Behavior: Behavior normal.        Thought Content: Thought content normal.        Judgment: Judgment normal.      No results found for any visits on 02/09/23.    The 10-year ASCVD risk score (Arnett DK, et al., 2019) is: 6.6%    Assessment & Plan:   Problem List Items Addressed This Visit       Nervous and Auditory   Acute otitis externa of  right ear - Primary    Acute Scant serous fluid located behind right eardrum, no evidence of infection.  Tragus is tender, concern for possible early otitis externa.  Because patient is repairing to travel will treat with Ciprodex 4 drops to right ear BID x 5-7 days.  Will also prescribe him course of Augmentin that he can take with him on his trip in the event his middle ear effusion progressions to infection while he is away.  Patient reports understanding.      Relevant Medications   amoxicillin-clavulanate (AUGMENTIN) 875-125 MG tablet   ciprofloxacin-dexamethasone (CIPRODEX) OTIC suspension    Return if symptoms worsen or fail to improve.    Elenore Paddy, NP

## 2023-02-13 NOTE — Telephone Encounter (Signed)
Completed.

## 2023-02-21 ENCOUNTER — Other Ambulatory Visit: Payer: Self-pay | Admitting: Internal Medicine

## 2023-02-21 DIAGNOSIS — F411 Generalized anxiety disorder: Secondary | ICD-10-CM

## 2023-02-26 ENCOUNTER — Other Ambulatory Visit: Payer: Self-pay | Admitting: Internal Medicine

## 2023-02-26 DIAGNOSIS — E781 Pure hyperglyceridemia: Secondary | ICD-10-CM

## 2023-02-27 ENCOUNTER — Other Ambulatory Visit: Payer: Self-pay | Admitting: Internal Medicine

## 2023-02-27 DIAGNOSIS — I10 Essential (primary) hypertension: Secondary | ICD-10-CM

## 2023-02-27 DIAGNOSIS — M1A09X0 Idiopathic chronic gout, multiple sites, without tophus (tophi): Secondary | ICD-10-CM

## 2023-02-28 ENCOUNTER — Other Ambulatory Visit: Payer: Self-pay | Admitting: Internal Medicine

## 2023-02-28 DIAGNOSIS — F411 Generalized anxiety disorder: Secondary | ICD-10-CM

## 2023-03-01 DIAGNOSIS — H40013 Open angle with borderline findings, low risk, bilateral: Secondary | ICD-10-CM | POA: Diagnosis not present

## 2023-03-01 DIAGNOSIS — H2513 Age-related nuclear cataract, bilateral: Secondary | ICD-10-CM | POA: Diagnosis not present

## 2023-03-12 DIAGNOSIS — E782 Mixed hyperlipidemia: Secondary | ICD-10-CM | POA: Diagnosis not present

## 2023-03-13 ENCOUNTER — Telehealth (HOSPITAL_BASED_OUTPATIENT_CLINIC_OR_DEPARTMENT_OTHER): Payer: Self-pay

## 2023-03-13 NOTE — Telephone Encounter (Addendum)
Left message for patient to call back     ----- Message from Michael Miller sent at 03/13/2023  7:40 AM EDT ----- Normal liver enzymes.  LDL (bad cholesterol) has not improved despite increased dose of atorvastatin.  It was previously 84 now it is 90 with goal being less than 70.  If not taking atorvastatin 40 mg routinely, recommend resume.  If has been taking atorvastatin routinely, recommend stop atorvastatin start rosuvastatin 40 mg daily.  Recommend aiming for 150 minutes of moderate intensity activity per week and following a heart healthy diet.    Repeat FLP/LFT in 2-3 months

## 2023-03-14 ENCOUNTER — Encounter (HOSPITAL_BASED_OUTPATIENT_CLINIC_OR_DEPARTMENT_OTHER): Payer: Self-pay

## 2023-03-14 DIAGNOSIS — E782 Mixed hyperlipidemia: Secondary | ICD-10-CM

## 2023-03-14 MED ORDER — ROSUVASTATIN CALCIUM 40 MG PO TABS
40.0000 mg | ORAL_TABLET | Freq: Every day | ORAL | 3 refills | Status: DC
Start: 2023-03-14 — End: 2023-04-16

## 2023-03-20 DIAGNOSIS — H0279 Other degenerative disorders of eyelid and periocular area: Secondary | ICD-10-CM | POA: Diagnosis not present

## 2023-03-20 DIAGNOSIS — H57813 Brow ptosis, bilateral: Secondary | ICD-10-CM | POA: Diagnosis not present

## 2023-04-11 ENCOUNTER — Other Ambulatory Visit: Payer: Self-pay | Admitting: Internal Medicine

## 2023-04-11 DIAGNOSIS — E291 Testicular hypofunction: Secondary | ICD-10-CM

## 2023-04-16 ENCOUNTER — Other Ambulatory Visit: Payer: Self-pay | Admitting: *Deleted

## 2023-04-16 DIAGNOSIS — E782 Mixed hyperlipidemia: Secondary | ICD-10-CM

## 2023-04-16 MED ORDER — ROSUVASTATIN CALCIUM 40 MG PO TABS
40.0000 mg | ORAL_TABLET | Freq: Every day | ORAL | 3 refills | Status: DC
Start: 2023-04-16 — End: 2024-03-20

## 2023-05-03 IMAGING — CT CT CARDIAC CORONARY ARTERY CALCIUM SCORE
3 series · 14 of 20 positions shown, 16 images · non-contrast
Comparison: None.
COMPARISON: None.

Addendum:
EXAM:
OVER-READ INTERPRETATION  CT CHEST

The following report is an over-read performed by radiologist Dr.
over-read does not include interpretation of cardiac or coronary
anatomy or pathology. The coronary calcium score interpretation by
the cardiologist is attached.
CLINICAL DATA: Cardiovascular Disease Risk stratification
Coronary Calcium Score
TECHNIQUE: A gated, non-contrast computed tomography scan of the heart was
performed using 3mm slice thickness. Axial images were analyzed on a
dedicated workstation. Calcium scoring of the coronary arteries was
performed using the Agatston method.

[Series 2: cascseq 2.0 sa36 70% (id) · axial · 0.39mm/px · z∈[+10,+112]mm · 4 of 85 slices shown]
[im 17/85  vessel]
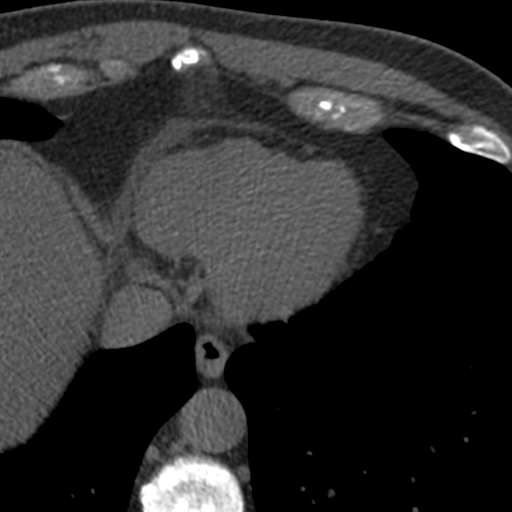
[im 34/85  vessel]
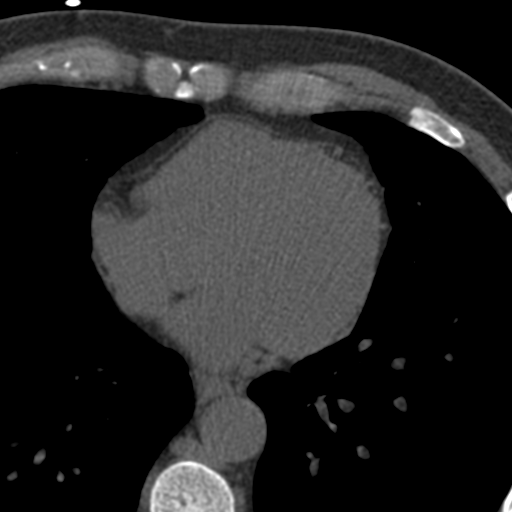
[im 51/85  vessel]
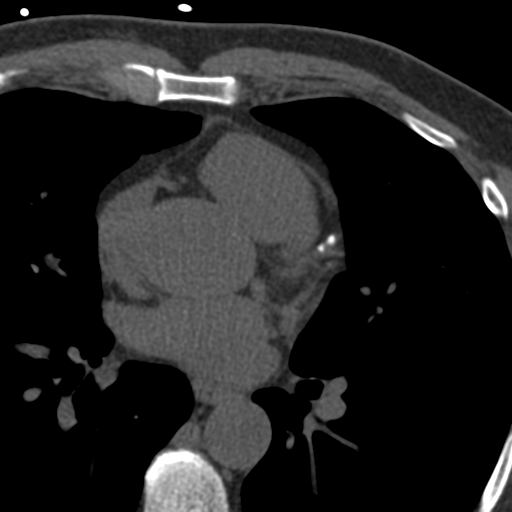
[im 68/85  vessel]
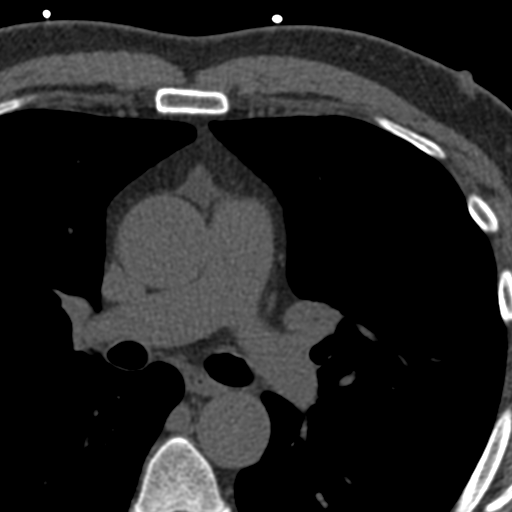

[Series 3: cascseq 2.0 bf37 st · axial · 0.73mm/px · z∈[+6,+118]mm · 5 of 85 slices shown, 7 images]
[im 15/85  vessel]
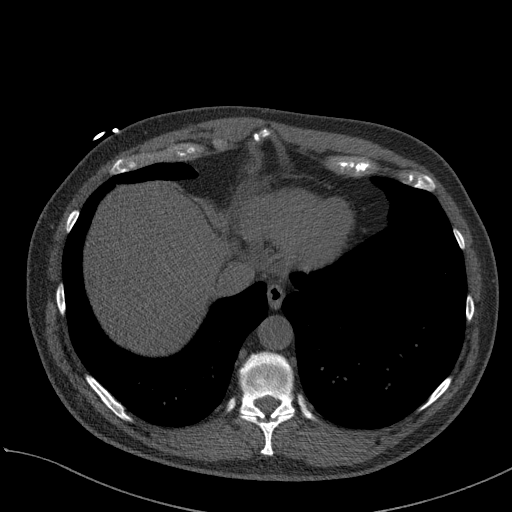
[im 15/85  lung]
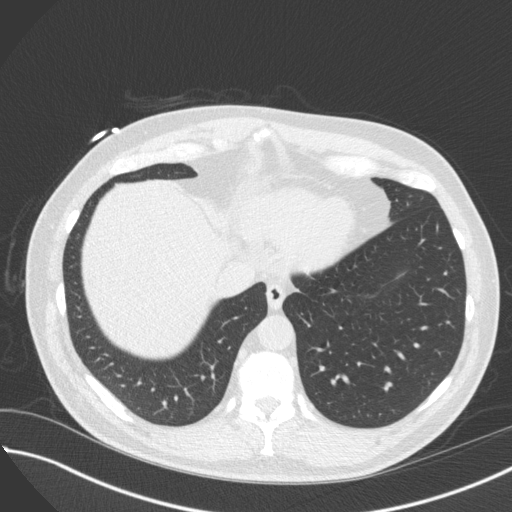
[im 29/85  vessel]
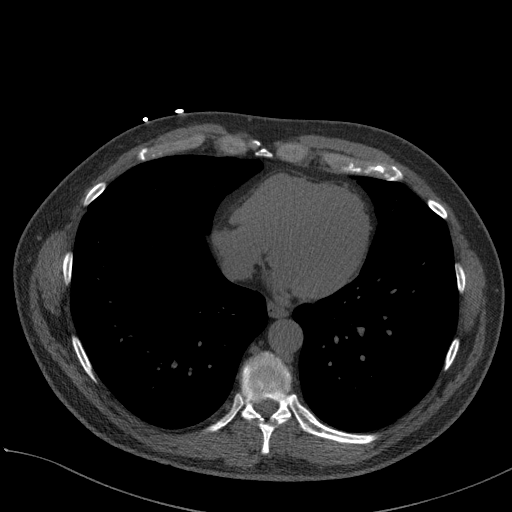
[im 43/85  vessel]
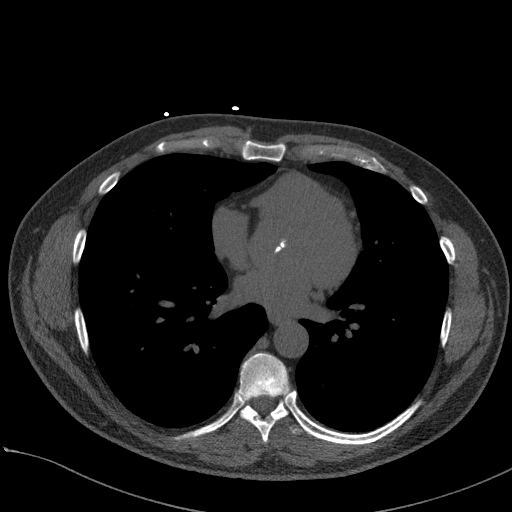
[im 57/85  vessel]
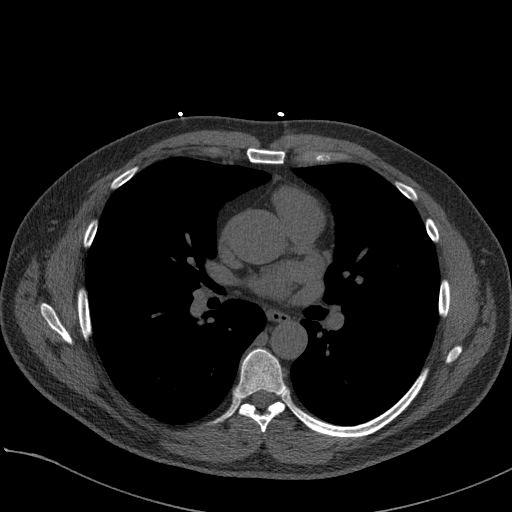
[im 71/85  vessel]
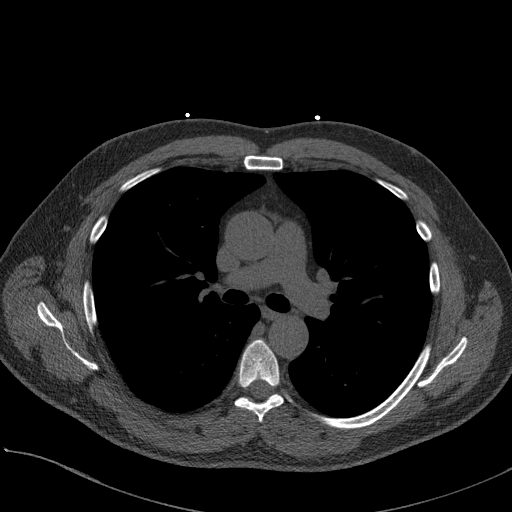
[im 71/85  lung]
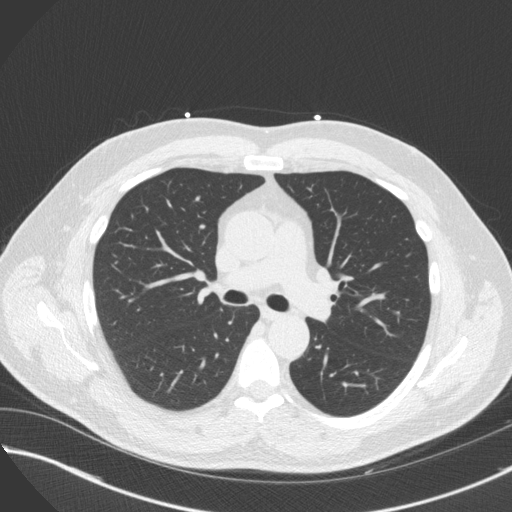

[Series 4: cascseq 2.0 br59 lung · axial · 0.73mm/px · z∈[+6,+118]mm · 5 of 85 slices shown]
[im 15/85  lung]
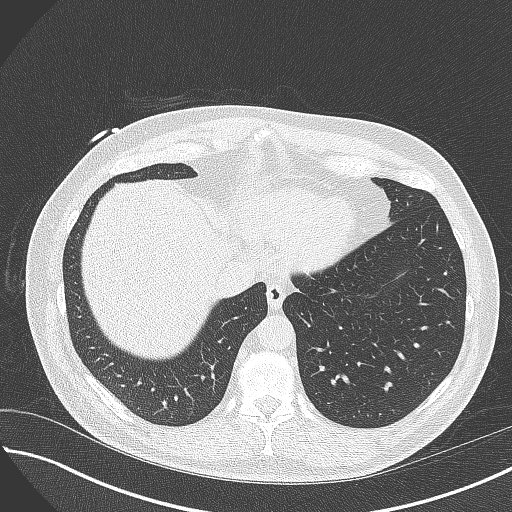
[im 29/85  lung]
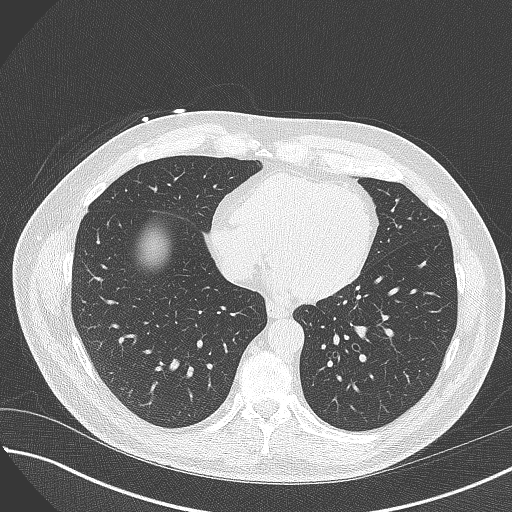
[im 43/85  lung]
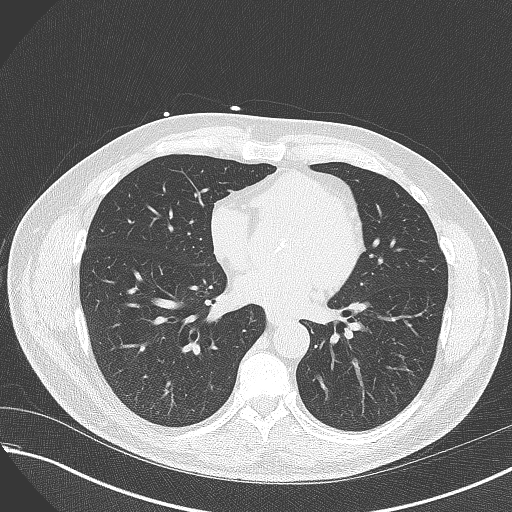
[im 57/85  lung]
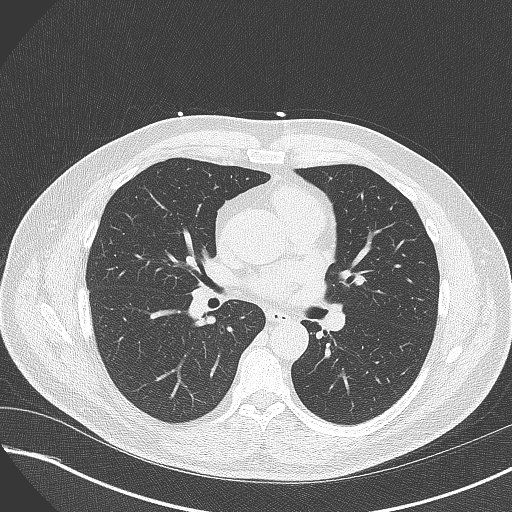
[im 71/85  lung]
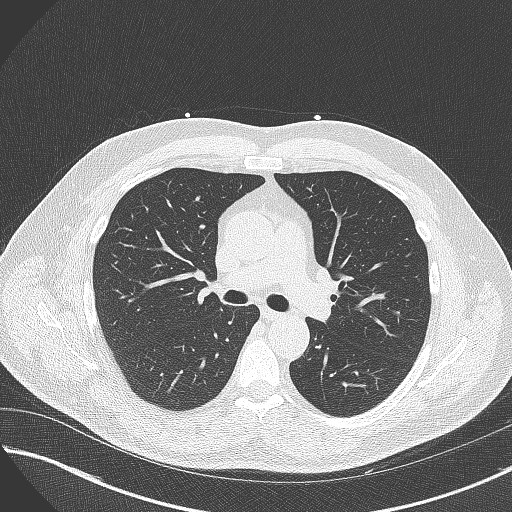

[14 of 20 positions shown; findings below may reference images not displayed]

FINDINGS: Vascular: The ascending thoracic aorta is top-normal in caliber
measuring up to approximately 3.9 cm.

Mediastinum/Nodes: Visualized mediastinum and hilar regions
demonstrate no lymphadenopathy or masses.

Lungs/Pleura: Visualized lungs show no evidence of pulmonary edema,
consolidation, pneumothorax, nodule or pleural fluid.

Upper Abdomen: No acute abnormality.

Musculoskeletal: No chest wall mass or suspicious bone lesions
identified.
IMPRESSION: Top-normal caliber of the ascending thoracic aorta measuring up to
approximately 3.9 cm.
FINDINGS: Coronary arteries: Normal origins.

Coronary Calcium Score:

Left main: 0

Left anterior descending artery: 57

Left circumflex artery: 76

Right coronary artery: 0

Total: 133

Percentile: 81

Pericardium: Normal.

Aorta: Borderline dilated caliber of ascending aorta. Aortic
atherosclerosis noted. Mild aortic valve calcification.

Non-cardiac: See separate report from [REDACTED].
IMPRESSION: Coronary calcium score of 133. This was 81st percentile for age-,
race-, and sex-matched controls.



If CAC=0, it is reasonable to withhold statin therapy and reassess
in 5 to 10 years, as long as higher risk conditions are absent
(diabetes mellitus, family history of premature CHD in first degree
relatives (males <55 years; females <65 years), cigarette smoking,
or LDL >=190 mg/dL).

If CAC is 1 to 99, it is reasonable to initiate statin therapy for
patients >=55 years of age.

If CAC is >=100 or >=75th percentile, it is reasonable to initiate
statin therapy at any age.

Cardiology referral should be considered for patients with CAC
scores >=400 or >=75th percentile.

*9561 AHA/ACC/AACVPR/AAPA/ABC/SRIYARATNE/ANDISHE/DANTE MARTIN/[place]/ELIU/MENORZINHA/SENN
Guideline on the Management of Blood Cholesterol: A Report of the
American College of Cardiology/American Heart Association Task Force
on Clinical Practice Guidelines. J Am Coll Cardiol.
6626;73(24):8913-8481.

*** End of Addendum ***
EXAM:
OVER-READ INTERPRETATION  CT CHEST

The following report is an over-read performed by radiologist Dr.
over-read does not include interpretation of cardiac or coronary
anatomy or pathology. The coronary calcium score interpretation by
the cardiologist is attached.
FINDINGS: Vascular: The ascending thoracic aorta is top-normal in caliber
measuring up to approximately 3.9 cm.

Mediastinum/Nodes: Visualized mediastinum and hilar regions
demonstrate no lymphadenopathy or masses.

Lungs/Pleura: Visualized lungs show no evidence of pulmonary edema,
consolidation, pneumothorax, nodule or pleural fluid.

Upper Abdomen: No acute abnormality.

Musculoskeletal: No chest wall mass or suspicious bone lesions
identified.
IMPRESSION: Top-normal caliber of the ascending thoracic aorta measuring up to
approximately 3.9 cm.

## 2023-05-10 ENCOUNTER — Other Ambulatory Visit: Payer: Self-pay | Admitting: Internal Medicine

## 2023-05-10 DIAGNOSIS — E291 Testicular hypofunction: Secondary | ICD-10-CM

## 2023-05-27 ENCOUNTER — Other Ambulatory Visit: Payer: Self-pay | Admitting: Internal Medicine

## 2023-05-27 DIAGNOSIS — F411 Generalized anxiety disorder: Secondary | ICD-10-CM

## 2023-05-28 ENCOUNTER — Other Ambulatory Visit: Payer: Self-pay | Admitting: Internal Medicine

## 2023-05-28 DIAGNOSIS — I10 Essential (primary) hypertension: Secondary | ICD-10-CM

## 2023-05-29 ENCOUNTER — Other Ambulatory Visit: Payer: Self-pay | Admitting: Internal Medicine

## 2023-05-29 DIAGNOSIS — F411 Generalized anxiety disorder: Secondary | ICD-10-CM

## 2023-06-10 ENCOUNTER — Other Ambulatory Visit: Payer: Self-pay | Admitting: Internal Medicine

## 2023-06-10 DIAGNOSIS — F411 Generalized anxiety disorder: Secondary | ICD-10-CM

## 2023-06-14 ENCOUNTER — Other Ambulatory Visit: Payer: Self-pay | Admitting: Internal Medicine

## 2023-06-14 DIAGNOSIS — F411 Generalized anxiety disorder: Secondary | ICD-10-CM

## 2023-06-15 DIAGNOSIS — E782 Mixed hyperlipidemia: Secondary | ICD-10-CM | POA: Diagnosis not present

## 2023-06-16 LAB — LIPID PANEL
Chol/HDL Ratio: 3.6 {ratio} (ref 0.0–5.0)
Cholesterol, Total: 135 mg/dL (ref 100–199)
HDL: 37 mg/dL — ABNORMAL LOW (ref >39)
LDL Chol Calc (NIH): 65 mg/dL (ref 0–99)
Triglycerides: 198 mg/dL — ABNORMAL HIGH (ref 0–149)
VLDL Cholesterol Cal: 33 mg/dL (ref 5–40)

## 2023-06-16 LAB — HEPATIC FUNCTION PANEL
ALT: 41 [IU]/L (ref 0–44)
AST: 39 [IU]/L (ref 0–40)
Albumin: 4.6 g/dL (ref 3.8–4.9)
Alkaline Phosphatase: 56 [IU]/L (ref 44–121)
Bilirubin Total: 0.8 mg/dL (ref 0.0–1.2)
Bilirubin, Direct: 0.24 mg/dL (ref 0.00–0.40)
Total Protein: 6.6 g/dL (ref 6.0–8.5)

## 2023-06-17 ENCOUNTER — Encounter (HOSPITAL_BASED_OUTPATIENT_CLINIC_OR_DEPARTMENT_OTHER): Payer: Self-pay

## 2023-06-19 ENCOUNTER — Encounter: Payer: Self-pay | Admitting: Internal Medicine

## 2023-06-19 NOTE — Telephone Encounter (Signed)
Pt has been scheduled.  °

## 2023-06-20 ENCOUNTER — Encounter: Payer: Self-pay | Admitting: Internal Medicine

## 2023-06-22 ENCOUNTER — Other Ambulatory Visit: Payer: Self-pay | Admitting: Internal Medicine

## 2023-06-22 ENCOUNTER — Telehealth: Payer: Self-pay | Admitting: Internal Medicine

## 2023-06-22 DIAGNOSIS — F411 Generalized anxiety disorder: Secondary | ICD-10-CM

## 2023-06-22 MED ORDER — CLONAZEPAM 0.5 MG PO TABS: ORAL_TABLET | ORAL | 0 refills | Status: DC

## 2023-06-22 MED ORDER — CLONAZEPAM 0.5 MG PO TABS
0.5000 mg | ORAL_TABLET | Freq: Two times a day (BID) | ORAL | 0 refills | Status: DC
Start: 2023-06-22 — End: 2023-08-06

## 2023-07-03 ENCOUNTER — Encounter: Payer: Self-pay | Admitting: Internal Medicine

## 2023-07-03 ENCOUNTER — Ambulatory Visit (INDEPENDENT_AMBULATORY_CARE_PROVIDER_SITE_OTHER): Payer: BC Managed Care – PPO | Admitting: Internal Medicine

## 2023-07-03 ENCOUNTER — Telehealth: Payer: Self-pay

## 2023-07-03 VITALS — BP 136/94 | HR 61 | Temp 97.5°F | Ht 70.0 in | Wt 197.4 lb

## 2023-07-03 DIAGNOSIS — F1010 Alcohol abuse, uncomplicated: Secondary | ICD-10-CM

## 2023-07-03 DIAGNOSIS — F411 Generalized anxiety disorder: Secondary | ICD-10-CM

## 2023-07-03 DIAGNOSIS — E291 Testicular hypofunction: Secondary | ICD-10-CM

## 2023-07-03 DIAGNOSIS — E039 Hypothyroidism, unspecified: Secondary | ICD-10-CM | POA: Diagnosis not present

## 2023-07-03 DIAGNOSIS — N522 Drug-induced erectile dysfunction: Secondary | ICD-10-CM

## 2023-07-03 DIAGNOSIS — I10 Essential (primary) hypertension: Secondary | ICD-10-CM

## 2023-07-03 DIAGNOSIS — M1A09X0 Idiopathic chronic gout, multiple sites, without tophus (tophi): Secondary | ICD-10-CM | POA: Diagnosis not present

## 2023-07-03 LAB — URIC ACID: Uric Acid, Serum: 5.9 mg/dL (ref 4.0–7.8)

## 2023-07-03 LAB — TSH: TSH: 1.87 u[IU]/mL (ref 0.35–5.50)

## 2023-07-03 LAB — CBC WITH DIFFERENTIAL/PLATELET
Basophils Absolute: 0 10*3/uL (ref 0.0–0.1)
Basophils Relative: 0.5 % (ref 0.0–3.0)
Eosinophils Absolute: 0.2 10*3/uL (ref 0.0–0.7)
Eosinophils Relative: 1.9 % (ref 0.0–5.0)
HCT: 53.7 % — ABNORMAL HIGH (ref 39.0–52.0)
Hemoglobin: 18.1 g/dL (ref 13.0–17.0)
Lymphocytes Relative: 25 % (ref 12.0–46.0)
Lymphs Abs: 2 10*3/uL (ref 0.7–4.0)
MCHC: 33.6 g/dL (ref 30.0–36.0)
MCV: 91.4 fL (ref 78.0–100.0)
Monocytes Absolute: 0.7 10*3/uL (ref 0.1–1.0)
Monocytes Relative: 8.2 % (ref 3.0–12.0)
Neutro Abs: 5.2 10*3/uL (ref 1.4–7.7)
Neutrophils Relative %: 64.4 % (ref 43.0–77.0)
Platelets: 233 10*3/uL (ref 150.0–400.0)
RBC: 5.88 Mil/uL — ABNORMAL HIGH (ref 4.22–5.81)
RDW: 13.9 % (ref 11.5–15.5)
WBC: 8.1 10*3/uL (ref 4.0–10.5)

## 2023-07-03 LAB — BASIC METABOLIC PANEL
BUN: 14 mg/dL (ref 6–23)
CO2: 29 meq/L (ref 19–32)
Calcium: 9.8 mg/dL (ref 8.4–10.5)
Chloride: 99 meq/L (ref 96–112)
Creatinine, Ser: 0.98 mg/dL (ref 0.40–1.50)
GFR: 84.98 mL/min (ref >60.00)
Glucose, Bld: 105 mg/dL — ABNORMAL HIGH (ref 70–99)
Potassium: 4.1 meq/L (ref 3.5–5.1)
Sodium: 137 meq/L (ref 135–145)

## 2023-07-03 MED ORDER — XYOSTED 50 MG/0.5ML ~~LOC~~ SOAJ
50.0000 mg | SUBCUTANEOUS | 0 refills | Status: DC
Start: 2023-07-03 — End: 2023-08-04

## 2023-07-03 NOTE — Patient Instructions (Signed)
[facility]  [phone]  Hypertension, Adult High blood pressure (hypertension) is when the force of blood pumping through the arteries is too strong. The arteries are the blood vessels that carry blood from the heart throughout the body. Hypertension forces the heart to work harder to pump blood and may cause arteries to become narrow or stiff. Untreated or uncontrolled hypertension can lead to a heart attack, heart failure, a stroke, kidney disease, and other problems. A blood pressure reading consists of a higher number over a lower number. Ideally, your blood pressure should be below 120/80. The first ("top") number is called the systolic pressure. It is a measure of the pressure in your arteries as your heart beats. The second ("bottom") number is called the diastolic pressure. It is a measure of the pressure in your arteries as the heart relaxes. What are the causes? The exact cause of this condition is not known. There are some conditions that result in high blood pressure. What increases the risk? Certain factors may make you more likely to develop high blood pressure. Some of these risk factors are under your control, including: Smoking. Not getting enough exercise or physical activity. Being overweight. Having too much fat, sugar, calories, or salt (sodium) in your diet. Drinking too much alcohol. Other risk factors include: Having a personal history of heart disease, diabetes, high cholesterol, or kidney disease. Stress. Having a family history of high blood pressure and high cholesterol. Having obstructive sleep apnea. Age. The risk increases with age. What are the signs or symptoms? High blood pressure may not cause symptoms. Very high blood pressure (hypertensive crisis) may cause: Headache. Fast or irregular heartbeats (palpitations). Shortness of breath. Nosebleed. Nausea and vomiting. Vision changes. Severe chest pain, dizziness, and seizures. How is this  diagnosed? This condition is diagnosed by measuring your blood pressure while you are seated, with your arm resting on a flat surface, your legs uncrossed, and your feet flat on the floor. The cuff of the blood pressure monitor will be placed directly against the skin of your upper arm at the level of your heart. Blood pressure should be measured at least twice using the same arm. Certain conditions can cause a difference in blood pressure between your right and left arms. If you have a high blood pressure reading during one visit or you have normal blood pressure with other risk factors, you may be asked to: Return on a different day to have your blood pressure checked again. Monitor your blood pressure at home for 1 week or longer. If you are diagnosed with hypertension, you may have other blood or imaging tests to help your health care provider understand your overall risk for other conditions. How is this treated? This condition is treated by making healthy lifestyle changes, such as eating healthy foods, exercising more, and reducing your alcohol intake. You may be referred for counseling on a healthy diet and physical activity. Your health care provider may prescribe medicine if lifestyle changes are not enough to get your blood pressure under control and if: Your systolic blood pressure is above 130. Your diastolic blood pressure is above 80. Your personal target blood pressure may vary depending on your medical conditions, your age, and other factors. Follow these instructions at home: Eating and drinking  Eat a diet that is high in fiber and potassium, and low in sodium, added sugar, and fat. An example of this eating plan is called the DASH diet. DASH stands for Dietary Approaches to Stop  Hypertension. To eat this way: Eat plenty of fresh fruits and vegetables. Try to fill one half of your plate at each meal with fruits and vegetables. Eat whole grains, such as whole-wheat pasta, brown  rice, or whole-grain bread. Fill about one fourth of your plate with whole grains. Eat or drink low-fat dairy products, such as skim milk or low-fat yogurt. Avoid fatty cuts of meat, processed or cured meats, and poultry with skin. Fill about one fourth of your plate with lean proteins, such as fish, chicken without skin, beans, eggs, or tofu. Avoid pre-made and processed foods. These tend to be higher in sodium, added sugar, and fat. Reduce your daily sodium intake. Many people with hypertension should eat less than 1,500 mg of sodium a day. Do not drink alcohol if: Your health care provider tells you not to drink. You are pregnant, may be pregnant, or are planning to become pregnant. If you drink alcohol: Limit how much you have to: 0-1 drink a day for women. 0-2 drinks a day for men. Know how much alcohol is in your drink. In the [place] one drink equals one 12 oz bottle of beer (355 mL), one 5 oz glass of wine (148 mL), or one 1 oz glass of hard liquor (44 mL). Lifestyle  Work with your health care provider to maintain a healthy body weight or to lose weight. Ask what an ideal weight is for you. Get at least 30 minutes of exercise that causes your heart to beat faster (aerobic exercise) most days of the week. Activities may include walking, swimming, or biking. Include exercise to strengthen your muscles (resistance exercise), such as Pilates or lifting weights, as part of your weekly exercise routine. Try to do these types of exercises for 30 minutes at least 3 days a week. Do not use any products that contain nicotine or tobacco. These products include cigarettes, chewing tobacco, and vaping devices, such as e-cigarettes. If you need help quitting, ask your health care provider. Monitor your blood pressure at home as told by your health care provider. Keep all follow-up visits. This is important. Medicines Take over-the-counter and prescription medicines only as told by your health care  provider. Follow directions carefully. Blood pressure medicines must be taken as prescribed. Do not skip doses of blood pressure medicine. Doing this puts you at risk for problems and can make the medicine less effective. Ask your health care provider about side effects or reactions to medicines that you should watch for. Contact a health care provider if you: Think you are having a reaction to a medicine you are taking. Have headaches that keep coming back (recurring). Feel dizzy. Have swelling in your ankles. Have trouble with your vision. Get help right away if you: Develop a severe headache or confusion. Have unusual weakness or numbness. Feel faint. Have severe pain in your chest or abdomen. Vomit repeatedly. Have trouble breathing. These symptoms may be an emergency. Get help right away. Call 911. Do not wait to see if the symptoms will go away. Do not drive yourself to the hospital. Summary Hypertension is when the force of blood pumping through your arteries is too strong. If this condition is not controlled, it may put you at risk for serious complications. Your personal target blood pressure may vary depending on your medical conditions, your age, and other factors. For most people, a normal blood pressure is less than 120/80. Hypertension is treated with lifestyle changes, medicines, or a combination of both. Lifestyle changes  include losing weight, eating a healthy, low-sodium diet, exercising more, and limiting alcohol. This information is not intended to replace advice given to you by your health care provider. Make sure you discuss any questions you have with your health care provider. Document Revised: 05/24/2021 Document Reviewed: 05/24/2021 Elsevier Patient Education  2024 [employer].

## 2023-07-03 NOTE — Progress Notes (Unsigned)
Subjective:  Patient ID: Michael Miller, male    DOB: 11/17/1965  Age: 57 y.o. MRN: [id number]  CC: Hypertension   HPI Michael Miller presents for f/up ----  Discussed the use of AI scribe software for clinical note transcription with the patient, who gave verbal consent to proceed.  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 He reports an increase in alcohol consumption, with daily intake and heavier drinking on weekends, which he believes is a coping mechanism for his current stressors. He denies any complications from alcohol use but acknowledges that it may be affecting his sleep, as he only gets about five to six hours of sleep per night.  In addition to his caretaker role, the patient is also managing his work responsibilities, which has added to his stress. His current regimen includes buspirone, sertraline, and Klonopin, with Klonopin being the only medication that alleviates his stress and anxiety.  The patient denies any headache, blurred vision, chest pain, shortness of breath, dizziness, lightheadedness, or swelling in his legs or feet. He also denies any coughing or shortness of breath. He has received his flu shot and a COVID-19 booster shot for the current season. He reports that his blood pressure has been consistently good, but it was elevated during the current visit, which he attributes to his increased anxiety.       Outpatient Medications Prior to Visit  Medication Sig Dispense Refill   allopurinol (ZYLOPRIM) 300 MG tablet TAKE 1 TABLET DAILY 90 tablet 0   busPIRone (BUSPAR) 10 MG tablet TAKE 1 TABLET BY MOUTH TWICE A DAY 180 tablet 1   clonazePAM (KLONOPIN) 0.5 MG tablet Take 1 tablet (0.5 mg total) by mouth 2 (two) times daily. (Patient taking differently: Take 0.5 mg by mouth  daily.) 30 tablet 0   finasteride (PROPECIA) 1 MG tablet TAKE 1 TABLET BY MOUTH EVERY DAY 90 tablet 1   MAGNESIUM PO Take by mouth 2 (two) times daily.     omega-3 acid ethyl esters (LOVAZA) 1 g capsule TAKE 2 CAPSULES BY MOUTH TWICE A DAY 360 capsule 0   rosuvastatin (CRESTOR) 40 MG tablet Take 1 tablet (40 mg total) by mouth daily. 90 tablet 3   sertraline (ZOLOFT) 50 MG tablet TAKE 1 TABLET DAILY 90 tablet 0   Testosterone Enanthate (XYOSTED) 75 MG/0.5ML SOAJ INJECT 75MG  SUBCUTANEOUSLY IN THE ABDOMEN ONCE WEEKLY AS DIRECTED, ROTATE SITES 2 mL 2   No facility-administered medications prior to visit.    ROS Review of Systems  Objective:  BP (!) 136/94 (BP Location: Left Arm, Patient Position: Sitting, Cuff Size: Normal)   Pulse 61   Temp (!) 97.5 F (36.4 C) (Oral)   Ht 5\' 10"  (1.778 m)   Wt 197 lb 6.4 oz (89.5 kg)   SpO2 97%   BMI 28.32 kg/m   BP Readings from Last 3 Encounters:  07/03/23 (!) 136/94  02/09/23 118/82  11/30/22 122/78    Wt Readings from Last 3 Encounters:  07/03/23 197 lb 6.4 oz (89.5 kg)  02/09/23 192 lb 6 oz (87.3 kg)  11/30/22 192 lb (87.1 kg)    Physical Exam  Lab Results  Component Value Date   WBC 8.1 07/03/2023   HGB 18.1 cH (HH) 07/03/2023   HCT 53.7 (H) 07/03/2023   PLT 233.0 07/03/2023  GLUCOSE 105 (H) 07/03/2023   CHOL 135 06/15/2023   TRIG 198 (H) 06/15/2023   HDL 37 (L) 06/15/2023   LDLDIRECT 100.0 07/27/2022   LDLCALC 65 06/15/2023   ALT 41 06/15/2023   AST 39 06/15/2023   NA 137 07/03/2023   K 4.1 07/03/2023   CL 99 07/03/2023   CREATININE 0.98 07/03/2023   BUN 14 07/03/2023   CO2 29 07/03/2023   TSH 1.87 07/03/2023   PSA 0.52 07/27/2022   HGBA1C 5.3 10/30/2019    CT CARDIAC SCORING (SELF PAY ONLY)  Addendum Date: 11/04/2021   ADDENDUM REPORT: 11/04/2021 10:07 CLINICAL DATA:  Cardiovascular Disease Risk stratification EXAM: Coronary Calcium Score TECHNIQUE: A gated, non-contrast computed tomography scan of the heart  was performed using 3mm slice thickness. Axial images were analyzed on a dedicated workstation. Calcium scoring of the coronary arteries was performed using the Agatston method. FINDINGS: Coronary arteries: Normal origins. Coronary Calcium Score: Left main: 0 Left anterior descending artery: 57 Left circumflex artery: 76 Right coronary artery: 0 Total: 133 Percentile: 81 Pericardium: Normal. Aorta: Borderline dilated caliber of ascending aorta. Aortic atherosclerosis noted. Mild aortic valve calcification. Non-cardiac: See separate report from [facility]. IMPRESSION: Coronary calcium score of 133. This was 81st percentile for age-, race-, and sex-matched controls. RECOMMENDATIONS: Coronary artery calcium (CAC) score is a strong predictor of incident coronary heart disease (CHD) and provides predictive information beyond traditional risk factors. CAC scoring is reasonable to use in the decision to withhold, postpone, or initiate statin therapy in intermediate-risk or selected borderline-risk asymptomatic adults (age 23-75 years and LDL-C >=70 to <190 mg/dL) who do not have diabetes or established atherosclerotic cardiovascular disease (ASCVD).* In intermediate-risk (10-year ASCVD risk >=7.5% to <20%) adults or selected borderline-risk (10-year ASCVD risk >=5% to <7.5%) adults in whom a CAC score is measured for the purpose of making a treatment decision the following recommendations have been made: If CAC=0, it is reasonable to withhold statin therapy and reassess in 5 to 10 years, as long as higher risk conditions are absent (diabetes mellitus, family history of premature CHD in first degree relatives (males <55 years; females <65 years), cigarette smoking, or LDL >=190 mg/dL). If CAC is 1 to 99, it is reasonable to initiate statin therapy for patients >=59 years of age. If CAC is >=100 or >=75th percentile, it is reasonable to initiate statin therapy at any age. Cardiology referral should be considered  for patients with CAC scores >=400 or >=75th percentile. *2018 AHA/ACC/AACVPR/AAPA/ABC/ACPM/ADA/AGS/APhA/ASPC/NLA/PCNA Guideline on the Management of Blood Cholesterol: A Report of the American College of Cardiology/American Heart Association Task Force on Clinical Practice Guidelines. J Am Coll Cardiol. 2019;73(24):3168-3209. Jodelle Red, MD Electronically Signed   By: Jodelle Red M.D.   On: 11/04/2021 10:07   Result Date: 11/04/2021 EXAM: OVER-READ INTERPRETATION  CT CHEST The following report is an over-read performed by radiologist Dr. Irish Lack of [facility] on 11/01/2021. This over-read does not include interpretation of cardiac or coronary anatomy or pathology. The coronary calcium score interpretation by the cardiologist is attached. COMPARISON:  None. FINDINGS: Vascular: The ascending thoracic aorta is top-normal in caliber measuring up to approximately 3.9 cm. Mediastinum/Nodes: Visualized mediastinum and hilar regions demonstrate no lymphadenopathy or masses. Lungs/Pleura: Visualized lungs show no evidence of pulmonary edema, consolidation, pneumothorax, nodule or pleural fluid. Upper Abdomen: No acute abnormality. Musculoskeletal: No chest wall mass or suspicious bone lesions identified. IMPRESSION: Top-normal caliber of the ascending thoracic aorta measuring up to approximately 3.9 cm. Electronically Signed:  By: Irish Lack M.D. On: 11/01/2021 16:44    Assessment & Plan:  Alcohol use disorder, mild, abuse -     AMB Referral VBCI Care Management  Primary hypertension -     Basic metabolic panel; Future -     Olmesartan Medoxomil; Take 1 tablet (20 mg total) by mouth daily.  Dispense: 90 tablet; Refill: 0  Idiopathic chronic gout of multiple sites without tophus -     Basic metabolic panel; Future -     Uric acid; Future  Hypogonadism male -     CBC with Differential/Platelet; Future -     Xyosted; Inject 50 mg into the skin once a week.   Dispense: 6 mL; Refill: 0  Acquired hypothyroidism -     TSH; Future  GAD (generalized anxiety disorder) -     Sertraline HCl; Take 1 tablet (100 mg total) by mouth daily.  Dispense: 90 tablet; Refill: 0     Follow-up: Return in about 3 months (around 10/01/2023).  Sanda Linger, MD

## 2023-07-03 NOTE — Telephone Encounter (Addendum)
CRITICAL VALUE STICKER  CRITICAL VALUE: Hemoglobin 18.1  RECEIVER (on-site recipient of call): [employer]   DATE & TIME NOTIFIED: 07/03/2023 at 3:30pm   MESSENGER (representative from lab): Clydie Braun   MD NOTIFIED: Yes   TIME OF NOTIFICATION:3:30PM

## 2023-07-04 ENCOUNTER — Encounter: Payer: Self-pay | Admitting: Internal Medicine

## 2023-07-04 ENCOUNTER — Telehealth: Payer: Self-pay | Admitting: *Deleted

## 2023-07-04 MED ORDER — SERTRALINE HCL 100 MG PO TABS: 100.0000 mg | ORAL_TABLET | Freq: Every day | ORAL | 0 refills | Status: DC

## 2023-07-04 MED ORDER — OLMESARTAN MEDOXOMIL 20 MG PO TABS: 20.0000 mg | ORAL_TABLET | Freq: Every day | ORAL | 0 refills | Status: DC

## 2023-07-04 NOTE — Progress Notes (Signed)
  Care Coordination   Note   07/04/2023 Name: Ayiden Casillas MRN: [id number] DOB: 1966/03/07  Daeshawn Eichholz is a 57 y.o. year old male who sees Etta Grandchild, MD for primary care. I reached out to Theodoro Kalata by phone today to offer care coordination services.  Mr. Corrales was given information about Care Coordination services today including:   The Care Coordination services include support from the care team which includes your Nurse Coordinator, Clinical Social Worker, or Pharmacist.  The Care Coordination team is here to help remove barriers to the health concerns and goals most important to you. Care Coordination services are voluntary, and the patient may decline or stop services at any time by request to their care team member.   Care Coordination Consent Status: Patient agreed to services and verbal consent obtained.   Follow up plan:  Telephone appointment with care coordination team member scheduled for:  07/09/2023  Encounter Outcome:  Patient Scheduled from referral   Burman Nieves, [facility] Care Coordination Care Guide Direct Dial: [phone]

## 2023-07-05 MED ORDER — TADALAFIL 10 MG PO TABS: 10.0000 mg | ORAL_TABLET | ORAL | 2 refills | Status: AC | PRN

## 2023-07-06 DIAGNOSIS — Z8249 Family history of ischemic heart disease and other diseases of the circulatory system: Secondary | ICD-10-CM | POA: Diagnosis not present

## 2023-07-06 DIAGNOSIS — Z713 Dietary counseling and surveillance: Secondary | ICD-10-CM | POA: Diagnosis not present

## 2023-07-09 ENCOUNTER — Ambulatory Visit: Payer: Self-pay | Admitting: Licensed Clinical Social Worker

## 2023-07-10 NOTE — Patient Outreach (Signed)
  Care Coordination   Initial Visit Note   07/09/2023 Name: Michael Miller MRN: [id number] DOB: Dec 17, 1965  [name] is a 57 y.o. year old male who sees Etta Grandchild, MD for primary care. I spoke with  Theodoro Kalata by phone today.  What matters to the patients health and wellness today?  LCSW A. Felton Clinton and Mr. Hartness completed initial phone call. Mr. Waddy is experience caregiver stress and needed interventions to alleviate stress. LCSW A. Felton Clinton educated Mr. Kocis on organizational skills, self care techniques and strongly urge to start delegating task to prevent burnout.     Goals Addressed             This Visit's Progress    Care Coordination       Activities and task to complete in order to accomplish goals.   Follow up on food resources discussed (Meal on Wheels for aging father)  Engage in stress reducing activity such as physical activity, delegating tasks and reducing alcohol consumption         SDOH assessments and interventions completed:  Yes     Care Coordination Interventions:  Yes, provided   Interventions Today    Flowsheet Row Most Recent Value  Chronic Disease   Chronic disease during today's visit Other  [Stress]  General Interventions   General Interventions Discussed/Reviewed [employer]. 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 Advise Mr. Kerbel of meals on wheels]  Exercise Interventions   Exercise Discussed/Reviewed Exercise Reviewed, Physical Activity  Physical Activity Discussed/Reviewed Types of exercise  [Encouraged Mr. Majkut to include physical activity such as walking, and cardio.]  Education Interventions   Education Provided Provided Education  [Advised Mr. Rodriges on strategies to reduce caregiver stress and organization skills.]  Provided Verbal Education On [employer], Other  Mental Health Interventions   Mental Health Discussed/Reviewed Coping Strategies, Mental Health Discussed, Other  [Discussed redirection  to decrease alcohol consumption]        Follow up plan: Follow up call scheduled for 07/23/2023    Encounter Outcome:  Patient Visit Completed   Gwyndolyn Saxon MSW, LCSW Licensed Clinical Social Worker  [facility], Population Health Direct Dial: [phone]  Fax: [phone]

## 2023-07-12 ENCOUNTER — Other Ambulatory Visit: Payer: Self-pay | Admitting: Internal Medicine

## 2023-07-12 DIAGNOSIS — F411 Generalized anxiety disorder: Secondary | ICD-10-CM

## 2023-07-17 ENCOUNTER — Other Ambulatory Visit: Payer: Self-pay | Admitting: Internal Medicine

## 2023-07-17 DIAGNOSIS — M1A09X0 Idiopathic chronic gout, multiple sites, without tophus (tophi): Secondary | ICD-10-CM

## 2023-07-18 DIAGNOSIS — Z713 Dietary counseling and surveillance: Secondary | ICD-10-CM | POA: Diagnosis not present

## 2023-07-18 DIAGNOSIS — Z8249 Family history of ischemic heart disease and other diseases of the circulatory system: Secondary | ICD-10-CM | POA: Diagnosis not present

## 2023-07-23 ENCOUNTER — Ambulatory Visit: Payer: Self-pay | Admitting: Licensed Clinical Social Worker

## 2023-07-23 NOTE — Patient Outreach (Signed)
  Care Coordination   07/23/2023 Name: Michael Miller MRN: [id number] DOB: 11/18/1965   Care Coordination Outreach Attempts:  An unsuccessful telephone outreach was attempted today to offer the patient information about available complex care management services.  Follow Up Plan:  Additional outreach attempts will be made to offer the patient complex care management information and services.   Encounter Outcome:  No Answer   Care Coordination Interventions:  No, not indicated    Gwyndolyn Saxon MSW, LCSW Licensed Clinical Social Worker  [facility], Population Health Direct Dial: [phone]  Fax: [phone]

## 2023-07-23 NOTE — Patient Instructions (Signed)
Visit Information  Please don't hesitate to contact me if I can be of assistance to you.   Following are the goals we discussed today: None LCSW A Felton Clinton called twice and left detailed voicemail requesting callback   Goals Addressed   None       Please call the care guide team at [phone] if you need to cancel or reschedule your appointment.   If you are experiencing a Mental Health or Behavioral Health Crisis or need someone to talk to, please call 911     Telephone follow up appointment with care management team member scheduled for:07/23/2023. Mr. Knope did not answer the call, left voicemail requesting callback     Gwyndolyn Saxon MSW, LCSW Licensed Clinical Social Worker  [facility], Population Health Direct Dial: [phone]  Fax: [phone]

## 2023-07-26 ENCOUNTER — Other Ambulatory Visit: Payer: Self-pay | Admitting: Internal Medicine

## 2023-07-26 DIAGNOSIS — E781 Pure hyperglyceridemia: Secondary | ICD-10-CM

## 2023-08-04 ENCOUNTER — Other Ambulatory Visit: Payer: Self-pay | Admitting: Internal Medicine

## 2023-08-04 DIAGNOSIS — E291 Testicular hypofunction: Secondary | ICD-10-CM

## 2023-08-04 DIAGNOSIS — F411 Generalized anxiety disorder: Secondary | ICD-10-CM

## 2023-08-05 ENCOUNTER — Other Ambulatory Visit: Payer: Self-pay | Admitting: Internal Medicine

## 2023-08-05 DIAGNOSIS — F411 Generalized anxiety disorder: Secondary | ICD-10-CM

## 2023-08-06 ENCOUNTER — Encounter: Payer: Self-pay | Admitting: Internal Medicine

## 2023-08-07 MED ORDER — XYOSTED 50 MG/0.5ML ~~LOC~~ SOAJ: 50.0000 mg | SUBCUTANEOUS | 0 refills | Status: DC

## 2023-08-10 ENCOUNTER — Telehealth: Payer: Self-pay

## 2023-08-10 ENCOUNTER — Other Ambulatory Visit (HOSPITAL_COMMUNITY): Payer: Self-pay

## 2023-08-10 NOTE — Telephone Encounter (Signed)
 Pharmacy Patient Advocate Encounter  Received notification from [employer] that Prior Authorization for Omega-3-acid  Ethyl Esters 1GM capsules  has been APPROVED from 08/10/2023 to 08/09/2024. Ran test claim, Copay is $0.00. This test claim was processed through [facility]- copay amounts may vary at other pharmacies due to pharmacy/plan contracts, or as the patient moves through the different stages of their insurance plan.   PA #/Case ID/Reference #: [facility]   Called patient pharmacy and left voicemail notifying them of approval

## 2023-08-24 ENCOUNTER — Other Ambulatory Visit: Payer: Self-pay | Admitting: Internal Medicine

## 2023-08-24 DIAGNOSIS — L659 Nonscarring hair loss, unspecified: Secondary | ICD-10-CM

## 2023-08-30 ENCOUNTER — Other Ambulatory Visit: Payer: Self-pay

## 2023-08-30 ENCOUNTER — Encounter: Payer: Self-pay | Admitting: Internal Medicine

## 2023-08-30 DIAGNOSIS — L659 Nonscarring hair loss, unspecified: Secondary | ICD-10-CM

## 2023-08-30 DIAGNOSIS — F411 Generalized anxiety disorder: Secondary | ICD-10-CM

## 2023-08-30 MED ORDER — FINASTERIDE 1 MG PO TABS: 1.0000 mg | ORAL_TABLET | Freq: Every day | ORAL | 1 refills | Status: DC

## 2023-08-30 MED ORDER — SERTRALINE HCL 100 MG PO TABS: 100.0000 mg | ORAL_TABLET | Freq: Every day | ORAL | 0 refills | Status: DC

## 2023-08-31 ENCOUNTER — Other Ambulatory Visit: Payer: Self-pay

## 2023-08-31 DIAGNOSIS — E781 Pure hyperglyceridemia: Secondary | ICD-10-CM

## 2023-08-31 DIAGNOSIS — I10 Essential (primary) hypertension: Secondary | ICD-10-CM

## 2023-08-31 MED ORDER — OLMESARTAN MEDOXOMIL 20 MG PO TABS: 20.0000 mg | ORAL_TABLET | Freq: Every day | ORAL | 0 refills | Status: DC

## 2023-08-31 MED ORDER — OMEGA-3-ACID ETHYL ESTERS 1 G PO CAPS: 2.0000 | ORAL_CAPSULE | Freq: Two times a day (BID) | ORAL | 0 refills | Status: DC

## 2023-10-03 ENCOUNTER — Other Ambulatory Visit: Payer: Self-pay | Admitting: Internal Medicine

## 2023-10-03 DIAGNOSIS — F411 Generalized anxiety disorder: Secondary | ICD-10-CM

## 2023-10-10 ENCOUNTER — Other Ambulatory Visit: Payer: Self-pay | Admitting: Internal Medicine

## 2023-10-10 DIAGNOSIS — F411 Generalized anxiety disorder: Secondary | ICD-10-CM

## 2023-10-11 ENCOUNTER — Other Ambulatory Visit: Payer: Self-pay

## 2023-10-11 ENCOUNTER — Encounter: Payer: Self-pay | Admitting: Internal Medicine

## 2023-10-11 ENCOUNTER — Telehealth: Admitting: Family Medicine

## 2023-10-11 DIAGNOSIS — J209 Acute bronchitis, unspecified: Secondary | ICD-10-CM

## 2023-10-11 MED ORDER — PROMETHAZINE-DM 6.25-15 MG/5ML PO SYRP: 5.0000 mL | ORAL_SOLUTION | Freq: Four times a day (QID) | ORAL | 0 refills | Status: DC | PRN

## 2023-10-11 MED ORDER — BENZONATATE 100 MG PO CAPS
100.0000 mg | ORAL_CAPSULE | Freq: Three times a day (TID) | ORAL | 0 refills | Status: AC | PRN
Start: 2023-10-11 — End: ?

## 2023-10-11 MED ORDER — AZITHROMYCIN 250 MG PO TABS: ORAL_TABLET | ORAL | 0 refills | Status: AC

## 2023-10-11 NOTE — Patient Instructions (Addendum)
 Theodoro Kalata, thank you for joining Freddy Finner, NP for today's virtual visit.  While this provider is not your primary care provider (PCP), if your PCP is located in our provider database this encounter information will be shared with them immediately following your visit.   A [facility] MyChart account gives you access to today's visit and all your visits, tests, and labs performed at [facility] " click here if you don't have a [facility] MyChart account or go to [email or web address]  Consent: (Patient) Michael Miller provided verbal consent for this virtual visit at the beginning of the encounter.  Current Medications:  Current Outpatient Medications:    [START ON 10/13/2023] azithromycin (ZITHROMAX) 250 MG tablet, Take 2 tablets on day 1, then 1 tablet daily on days 2 through 5, Disp: 6 tablet, Rfl: 0   benzonatate (TESSALON) 100 MG capsule, Take 1 capsule (100 mg total) by mouth 3 (three) times daily as needed for cough., Disp: 30 capsule, Rfl: 0   promethazine-dextromethorphan (PROMETHAZINE-DM) 6.25-15 MG/5ML syrup, Take 5 mLs by mouth 4 (four) times daily as needed for cough., Disp: 118 mL, Rfl: 0   allopurinol (ZYLOPRIM) 300 MG tablet, TAKE 1 TABLET DAILY, Disp: 90 tablet, Rfl: 0   busPIRone (BUSPAR) 10 MG tablet, TAKE 1 TABLET TWICE A DAY, Disp: 180 tablet, Rfl: 3   clonazePAM (KLONOPIN) 0.5 MG tablet, TAKE 1 TABLET BY MOUTH EVERY DAY AS NEEDED FOR ANXIETY, Disp: 30 tablet, Rfl: 1   finasteride (PROPECIA) 1 MG tablet, Take 1 tablet (1 mg total) by mouth daily., Disp: 90 tablet, Rfl: 1   MAGNESIUM PO, Take by mouth 2 (two) times daily., Disp: , Rfl:    olmesartan (BENICAR) 20 MG tablet, Take 1 tablet (20 mg total) by mouth daily., Disp: 90 tablet, Rfl: 0   omega-3 acid ethyl esters (LOVAZA) 1 g capsule, Take 2 capsules (2 g total) by mouth 2 (two) times daily., Disp: 360 capsule, Rfl: 0   rosuvastatin (CRESTOR) 40 MG tablet, Take 1 tablet (40 mg total) by mouth daily.,  Disp: 90 tablet, Rfl: 3   sertraline (ZOLOFT) 100 MG tablet, TAKE 1 TABLET BY MOUTH EVERY DAY, Disp: 90 tablet, Rfl: 0   tadalafil (CIALIS) 10 MG tablet, Take 1 tablet (10 mg total) by mouth every other day as needed for erectile dysfunction., Disp: 10 tablet, Rfl: 2   Testosterone Enanthate (XYOSTED) 50 MG/0.5ML SOAJ, Inject 50 mg into the skin once a week., Disp: 6 mL, Rfl: 0   Medications ordered in this encounter:  Meds ordered this encounter  Medications   promethazine-dextromethorphan (PROMETHAZINE-DM) 6.25-15 MG/5ML syrup    Sig: Take 5 mLs by mouth 4 (four) times daily as needed for cough.    Dispense:  118 mL    Refill:  0    Supervising Provider:   Merrilee Jansky [[id number]]   benzonatate (TESSALON) 100 MG capsule    Sig: Take 1 capsule (100 mg total) by mouth 3 (three) times daily as needed for cough.    Dispense:  30 capsule    Refill:  0    Supervising Provider:   Merrilee Jansky [[id number]]   azithromycin (ZITHROMAX) 250 MG tablet    Sig: Take 2 tablets on day 1, then 1 tablet daily on days 2 through 5    Dispense:  6 tablet    Refill:  0    Supervising Provider:   Merrilee Jansky [[id number]]     *If you need refills on other  medications prior to your next appointment, please contact your pharmacy*  Follow-Up: Call back or seek an in-person evaluation if the symptoms worsen or if the condition fails to improve as anticipated.  [facility] [phone]  Other Instructions  - Take meds as prescribed - Rest voice - Use a cool mist humidifier especially during the winter months when heat dries out the air. - Use saline nose sprays frequently to help soothe nasal passages if they are drying out. - Stay hydrated by drinking plenty of fluids - Keep thermostat turn down low to prevent drying out which can cause a dry cough. - For fever or aches or pains- take tylenol or ibuprofen as directed on bottle             * for fevers greater than 101 orally you  may alternate ibuprofen and tylenol every 3 hours.  If you do not improve you will need a follow up visit in person.                  If you have been instructed to have an in-person evaluation today at a local Urgent Care facility, please use the link below. It will take you to a list of all of our available [facility], including address, phone number and hours of operation. Please do not delay care.  [facility]  If you or a family member do not have a primary care provider, use the link below to schedule a visit and establish care. When you choose a [facility] primary care physician or advanced practice provider, you gain a long-term partner in health. Find a Primary Care Provider  Learn more about [facility] in-office and virtual care options: [facility] - Get Care Now

## 2023-10-11 NOTE — Progress Notes (Signed)
 Virtual Visit Consent   [name], you are scheduled for a virtual visit with a [facility] provider today. Just as with appointments in the office, your consent must be obtained to participate. Your consent will be active for this visit and any virtual visit you may have with one of our providers in the next 365 days. If you have a MyChart account, a copy of this consent can be sent to you electronically.  As this is a virtual visit, video technology does not allow for your provider to perform a traditional examination. This may limit your provider's ability to fully assess your condition. If your provider identifies any concerns that need to be evaluated in person or the need to arrange testing (such as labs, EKG, etc.), we will make arrangements to do so. Although advances in technology are sophisticated, we cannot ensure that it will always work on either your end or our end. If the connection with a video visit is poor, the visit may have to be switched to a telephone visit. With either a video or telephone visit, we are not always able to ensure that we have a secure connection.  By engaging in this virtual visit, you consent to the provision of healthcare and authorize for your insurance to be billed (if applicable) for the services provided during this visit. Depending on your insurance coverage, you may receive a charge related to this service.  I need to obtain your verbal consent now. Are you willing to proceed with your visit today? Walt Geathers has provided verbal consent on 10/11/2023 for a virtual visit (video or telephone). Freddy Finner, NP  Date: 10/11/2023 11:32 AM   Virtual Visit via Video Note   I, Freddy Finner, connected with  Saim Almanza  ([id number], 08/06/1965) on 10/11/23 at 11:30 AM EDT by a video-enabled telemedicine application and verified that I am speaking with the correct person using two identifiers.  Location: Patient: Virtual Visit Location Patient:  Home Provider: Virtual Visit Location Provider: Home Office   I discussed the limitations of evaluation and management by telemedicine and the availability of in person appointments. The patient expressed understanding and agreed to proceed.    History of Present Illness: Michael Miller is a 58 y.o. who identifies as a male who was assigned male at birth, and is being seen today for congestion and cough.  Has been helping his elderly father who has been sick with bronchitis, and there was not a mask wore. On Monday he started having chest congestion and now as a on going cough and sore throat. Dry Cough  Modifying factors- throat spray and lemon drops Denies chest pain, shortness of breath, fevers or chills   Problems:  Patient Active Problem List   Diagnosis Date Noted   Alcohol use disorder, mild, abuse 07/03/2023   Hypertriglyceridemia 07/28/2022   BMI 27.0-27.9,adult 07/27/2022   Frontal balding 06/09/2022   Right BBB/left ant fasc block 08/25/2021   Hypogonadism male 08/25/2021   GAD (generalized anxiety disorder) 05/30/2021   Irritable bowel syndrome with diarrhea 05/30/2021   Drug-induced erectile dysfunction 05/30/2021   Encounter for general adult medical examination with abnormal findings 12/22/2020   Allergic rhinitis 10/26/2020   Polyp of colon 09/20/2020   Idiopathic chronic gout of multiple sites without tophus 09/02/2020   Acquired hypothyroidism 09/02/2020   Hyperlipidemia with target LDL less than 130 09/02/2020   Primary hypertension 09/02/2020   Primary osteoarthritis involving multiple joints 08/26/2020    Allergies: No Known  Allergies Medications:  Current Outpatient Medications:    allopurinol (ZYLOPRIM) 300 MG tablet, TAKE 1 TABLET DAILY, Disp: 90 tablet, Rfl: 0   busPIRone (BUSPAR) 10 MG tablet, TAKE 1 TABLET TWICE A DAY, Disp: 180 tablet, Rfl: 3   clonazePAM (KLONOPIN) 0.5 MG tablet, TAKE 1 TABLET BY MOUTH EVERY DAY AS NEEDED FOR ANXIETY, Disp: 30 tablet,  Rfl: 1   finasteride (PROPECIA) 1 MG tablet, Take 1 tablet (1 mg total) by mouth daily., Disp: 90 tablet, Rfl: 1   MAGNESIUM PO, Take by mouth 2 (two) times daily., Disp: , Rfl:    olmesartan (BENICAR) 20 MG tablet, Take 1 tablet (20 mg total) by mouth daily., Disp: 90 tablet, Rfl: 0   omega-3 acid ethyl esters (LOVAZA) 1 g capsule, Take 2 capsules (2 g total) by mouth 2 (two) times daily., Disp: 360 capsule, Rfl: 0   rosuvastatin (CRESTOR) 40 MG tablet, Take 1 tablet (40 mg total) by mouth daily., Disp: 90 tablet, Rfl: 3   sertraline (ZOLOFT) 100 MG tablet, TAKE 1 TABLET BY MOUTH EVERY DAY, Disp: 90 tablet, Rfl: 0   tadalafil (CIALIS) 10 MG tablet, Take 1 tablet (10 mg total) by mouth every other day as needed for erectile dysfunction., Disp: 10 tablet, Rfl: 2   Testosterone Enanthate (XYOSTED) 50 MG/0.5ML SOAJ, Inject 50 mg into the skin once a week., Disp: 6 mL, Rfl: 0  Observations/Objective: Patient is well-developed, well-nourished in no acute distress.  Resting comfortably  at home.  Head is normocephalic, atraumatic.  No labored breathing.  Speech is clear and coherent with logical content.  Patient is alert and oriented at baseline.    Assessment and Plan:  1. Acute bronchitis, unspecified organism (Primary)  - promethazine-dextromethorphan (PROMETHAZINE-DM) 6.25-15 MG/5ML syrup; Take 5 mLs by mouth 4 (four) times daily as needed for cough.  Dispense: 118 mL; Refill: 0 - benzonatate (TESSALON) 100 MG capsule; Take 1 capsule (100 mg total) by mouth 3 (three) times daily as needed for cough.  Dispense: 30 capsule; Refill: 0 - azithromycin (ZITHROMAX) 250 MG tablet; Take 2 tablets on day 1, then 1 tablet daily on days 2 through 5  Dispense: 6 tablet; Refill: 0   -Delayed Rx  - Take meds as prescribed - Rest voice - Use a cool mist humidifier especially during the winter months when heat dries out the air. - Use saline nose sprays frequently to help soothe nasal passages if they  are drying out. - Stay hydrated by drinking plenty of fluids - Keep thermostat turn down low to prevent drying out which can cause a dry cough. - For fever or aches or pains- take tylenol or ibuprofen as directed on bottle             * for fevers greater than 101 orally you may alternate ibuprofen and tylenol every 3 hours.  If you do not improve you will need a follow up visit in person.                 Reviewed side effects, risks and benefits of medication.    Patient acknowledged agreement and understanding of the plan.   Past Medical, Surgical, Social History, Allergies, and Medications have been Reviewed.    Follow Up Instructions: I discussed the assessment and treatment plan with the patient. The patient was provided an opportunity to ask questions and all were answered. The patient agreed with the plan and demonstrated an understanding of the instructions.  A copy of instructions were  sent to the patient via MyChart unless otherwise noted below.    The patient was advised to call back or seek an in-person evaluation if the symptoms worsen or if the condition fails to improve as anticipated.    Freddy Finner, NP

## 2023-10-15 ENCOUNTER — Other Ambulatory Visit: Payer: Self-pay | Admitting: Internal Medicine

## 2023-10-15 DIAGNOSIS — M1A09X0 Idiopathic chronic gout, multiple sites, without tophus (tophi): Secondary | ICD-10-CM

## 2023-10-17 ENCOUNTER — Other Ambulatory Visit (HOSPITAL_BASED_OUTPATIENT_CLINIC_OR_DEPARTMENT_OTHER): Payer: Self-pay

## 2023-10-17 ENCOUNTER — Encounter (HOSPITAL_BASED_OUTPATIENT_CLINIC_OR_DEPARTMENT_OTHER): Payer: Self-pay

## 2023-10-17 DIAGNOSIS — E782 Mixed hyperlipidemia: Secondary | ICD-10-CM

## 2023-11-05 DIAGNOSIS — H2513 Age-related nuclear cataract, bilateral: Secondary | ICD-10-CM | POA: Diagnosis not present

## 2023-11-05 DIAGNOSIS — H40013 Open angle with borderline findings, low risk, bilateral: Secondary | ICD-10-CM | POA: Diagnosis not present

## 2023-11-12 ENCOUNTER — Other Ambulatory Visit: Payer: Self-pay | Admitting: Internal Medicine

## 2023-11-12 DIAGNOSIS — F411 Generalized anxiety disorder: Secondary | ICD-10-CM

## 2023-11-30 ENCOUNTER — Other Ambulatory Visit: Payer: Self-pay | Admitting: Internal Medicine

## 2023-11-30 DIAGNOSIS — E291 Testicular hypofunction: Secondary | ICD-10-CM

## 2023-12-04 ENCOUNTER — Other Ambulatory Visit: Payer: Self-pay | Admitting: Internal Medicine

## 2023-12-04 ENCOUNTER — Telehealth: Payer: Self-pay | Admitting: Internal Medicine

## 2023-12-04 DIAGNOSIS — E291 Testicular hypofunction: Secondary | ICD-10-CM

## 2023-12-04 NOTE — Telephone Encounter (Signed)
 Copied from CRM [id number]. Topic: Clinical - Prescription Issue >> Dec 04, 2023 12:47 PM Freya Jesus wrote: Reason for CRM: Margretta Shi calling from [facility] pharmacy regarding patient medication request that was faxed and sent over 5/2 and 5/6 for Testosterone  Enanthate (XYOSTED ) 50 MG/0.5ML SOAJ [[id number]].

## 2023-12-05 MED ORDER — XYOSTED 50 MG/0.5ML ~~LOC~~ SOAJ
50.0000 mg | SUBCUTANEOUS | 0 refills | Status: DC
Start: 2023-12-05 — End: 2023-12-06

## 2023-12-06 ENCOUNTER — Other Ambulatory Visit: Payer: Self-pay

## 2023-12-06 DIAGNOSIS — E291 Testicular hypofunction: Secondary | ICD-10-CM

## 2023-12-06 MED ORDER — XYOSTED 50 MG/0.5ML ~~LOC~~ SOAJ: 50.0000 mg | SUBCUTANEOUS | 0 refills | Status: DC

## 2023-12-06 NOTE — Telephone Encounter (Signed)
 Medication refill request has been sent to Dr. Yetta Barre

## 2023-12-11 ENCOUNTER — Other Ambulatory Visit: Payer: Self-pay

## 2023-12-15 ENCOUNTER — Other Ambulatory Visit: Payer: Self-pay | Admitting: Internal Medicine

## 2023-12-15 DIAGNOSIS — F411 Generalized anxiety disorder: Secondary | ICD-10-CM

## 2023-12-16 ENCOUNTER — Other Ambulatory Visit: Payer: Self-pay | Admitting: Internal Medicine

## 2023-12-16 ENCOUNTER — Encounter: Payer: Self-pay | Admitting: Internal Medicine

## 2023-12-16 DIAGNOSIS — F411 Generalized anxiety disorder: Secondary | ICD-10-CM

## 2023-12-16 DIAGNOSIS — E781 Pure hyperglyceridemia: Secondary | ICD-10-CM

## 2023-12-17 NOTE — Telephone Encounter (Unsigned)
 Copied from CRM [id number]. Topic: Clinical - Medication Refill >> Dec 17, 2023  1:34 PM [place] M wrote: Medication: clonazePAM  (KLONOPIN ) 0.5 MG tablet   Has the patient contacted their pharmacy? Yes (Agent: If no, request that the patient contact the pharmacy for the refill. If patient does not wish to contact the pharmacy document the reason why and proceed with request.) (Agent: If yes, when and what did the pharmacy advise?)  [id number] - [place], [place] - [address] AT [facility] [address] [place] [address] Phone: [phone] Fax: [phone]   Is this the correct pharmacy for this prescription? Yes If no, delete pharmacy and type the correct one.   Has the prescription been filled recently? Yes  Is the patient out of the medication? Yes  Has the patient been seen for an appointment in the last year OR does the patient have an upcoming appointment? Yes  Can we respond through MyChart? Yes  Agent: Please be advised that Rx refills may take up to 3 business days. We ask that you follow-up with your pharmacy.

## 2023-12-18 ENCOUNTER — Other Ambulatory Visit: Payer: Self-pay

## 2023-12-18 DIAGNOSIS — F411 Generalized anxiety disorder: Secondary | ICD-10-CM

## 2023-12-18 MED ORDER — CLONAZEPAM 0.5 MG PO TABS: 0.5000 mg | ORAL_TABLET | Freq: Every day | ORAL | 1 refills | Status: DC

## 2023-12-19 ENCOUNTER — Other Ambulatory Visit: Payer: Self-pay | Admitting: Internal Medicine

## 2023-12-19 DIAGNOSIS — E781 Pure hyperglyceridemia: Secondary | ICD-10-CM

## 2023-12-20 ENCOUNTER — Other Ambulatory Visit: Payer: Self-pay | Admitting: Internal Medicine

## 2023-12-20 DIAGNOSIS — I10 Essential (primary) hypertension: Secondary | ICD-10-CM

## 2023-12-21 ENCOUNTER — Other Ambulatory Visit: Payer: Self-pay | Admitting: Family

## 2023-12-21 DIAGNOSIS — F411 Generalized anxiety disorder: Secondary | ICD-10-CM

## 2023-12-21 MED ORDER — CLONAZEPAM 0.5 MG PO TABS: 0.5000 mg | ORAL_TABLET | Freq: Every day | ORAL | 1 refills | Status: DC

## 2023-12-27 DIAGNOSIS — E782 Mixed hyperlipidemia: Secondary | ICD-10-CM | POA: Diagnosis not present

## 2023-12-28 LAB — LIPID PANEL
Chol/HDL Ratio: 4.2 ratio (ref 0.0–5.0)
Cholesterol, Total: 161 mg/dL (ref 100–199)
HDL: 38 mg/dL — ABNORMAL LOW (ref >39)
LDL Chol Calc (NIH): 83 mg/dL (ref 0–99)
Triglycerides: 239 mg/dL — ABNORMAL HIGH (ref 0–149)
VLDL Cholesterol Cal: 40 mg/dL (ref 5–40)

## 2023-12-28 LAB — HEPATIC FUNCTION PANEL
ALT: 47 IU/L — ABNORMAL HIGH (ref 0–44)
AST: 43 IU/L — ABNORMAL HIGH (ref 0–40)
Albumin: 4.9 g/dL (ref 3.8–4.9)
Alkaline Phosphatase: 61 IU/L (ref 44–121)
Bilirubin Total: 0.7 mg/dL (ref 0.0–1.2)
Bilirubin, Direct: 0.21 mg/dL (ref 0.00–0.40)
Total Protein: 7.1 g/dL (ref 6.0–8.5)

## 2024-01-09 ENCOUNTER — Encounter (HOSPITAL_BASED_OUTPATIENT_CLINIC_OR_DEPARTMENT_OTHER): Payer: Self-pay | Admitting: Cardiology

## 2024-01-09 ENCOUNTER — Ambulatory Visit (INDEPENDENT_AMBULATORY_CARE_PROVIDER_SITE_OTHER): Admitting: Cardiology

## 2024-01-09 VITALS — BP 136/68 | HR 67 | Ht 70.0 in | Wt 191.0 lb

## 2024-01-09 DIAGNOSIS — R011 Cardiac murmur, unspecified: Secondary | ICD-10-CM

## 2024-01-09 DIAGNOSIS — R7401 Elevation of levels of liver transaminase levels: Secondary | ICD-10-CM

## 2024-01-09 DIAGNOSIS — Z7189 Other specified counseling: Secondary | ICD-10-CM

## 2024-01-09 DIAGNOSIS — I10 Essential (primary) hypertension: Secondary | ICD-10-CM

## 2024-01-09 DIAGNOSIS — I2510 Atherosclerotic heart disease of native coronary artery without angina pectoris: Secondary | ICD-10-CM

## 2024-01-09 DIAGNOSIS — I4510 Unspecified right bundle-branch block: Secondary | ICD-10-CM

## 2024-01-09 DIAGNOSIS — Z8249 Family history of ischemic heart disease and other diseases of the circulatory system: Secondary | ICD-10-CM

## 2024-01-09 DIAGNOSIS — E782 Mixed hyperlipidemia: Secondary | ICD-10-CM | POA: Diagnosis not present

## 2024-01-09 DIAGNOSIS — Z712 Person consulting for explanation of examination or test findings: Secondary | ICD-10-CM | POA: Diagnosis not present

## 2024-01-09 NOTE — Progress Notes (Signed)
 Cardiology Office Note:  .   Date:  01/09/2024  ID:  Michael Miller, DOB 1965-09-22, MRN [id number] PCP: Arcadio Knuckles, MD  [facility] Providers Cardiologist:  Sheryle Donning, MD {  History of Present Illness: .   Michael Miller is a 58 y.o. male  with a hx of hypertension, hyperlipidemia (with very high triglycerides), nonobstructive CAD by calcium  score, who is seen for follow up today. 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   CV history of RBBB and LAFB that was previously evaluated by a cardiologist in [place] years ago. Hypertension >10 years, hyperlipidemia ~7-8 years ago. Former smoker. Smoked for about 20 years. Drinks socially, a couple cocktails or wine in a given night. Family history: His father had aortic valve replacement around 59 yo, CABG x3. His brother had a stent placement at 31 yo.   Calcium  score 10/2021 133 (81st %ile).  Today: Reviewed recent lipids, see below. Tolerating rosuvastatin  and lovaza . TG above 150 but down from peak. Works out 6-7 days/week, cardio and weights. Drinking alcohol 1-2 glasses/night of wine, more on the weekends. Husband stopped drinking but this didn't change his pattern. Diet is overall good.   Occasionally feels short of breath at the beginning of a workout, but gets better with activity.  BP borderline elevated, has been slightly up at home as well.  ROS: Denies chest pain, shortness of breath at rest. No PND, orthopnea, LE edema or unexpected weight gain. No syncope or palpitations. ROS otherwise negative except as noted.   Studies Reviewed: Aaron Aas    EKG:  EKG Interpretation Date/Time:  Wednesday January 09 2024 08:51:23 EDT Ventricular Rate:  67 PR Interval:  162 QRS Duration:  138 QT Interval:  408 QTC Calculation: 431 R Axis:   -82  Text Interpretation: Normal sinus rhythm Left axis deviation Right bundle branch block Confirmed by  Sheryle Donning [phone]) on 01/09/2024 9:38:19 AM    Physical Exam:   VS:  BP 136/68   Pulse 67   Ht 5' 10 (1.778 m)   Wt 191 lb (86.6 kg)   SpO2 95%   BMI 27.41 kg/m    Wt Readings from Last 3 Encounters:  01/09/24 191 lb (86.6 kg)  07/03/23 197 lb 6.4 oz (89.5 kg)  02/09/23 192 lb 6 oz (87.3 kg)    GEN: Well nourished, well developed in no acute distress HEENT: Normal, moist mucous membranes NECK: No JVD CARDIAC: regular rhythm, normal S1 and S2, no rubs or gallops. 1/6 systolic murmur. VASCULAR: Radial and DP pulses 2+ bilaterally. No carotid bruits RESPIRATORY:  Clear to auscultation without rales, wheezing or rhonchi  ABDOMEN: Soft, non-tender, non-distended MUSCULOSKELETAL:  Ambulates independently SKIN: Warm and dry, no edema NEUROLOGIC:  Alert and oriented x 3. No focal neuro deficits noted. PSYCHIATRIC:  Normal affect    ASSESSMENT AND PLAN: .    Family history of heart disease Hypercholesterolemia/Hypertriglyceridemia, consistent with mixed hyperlipidemia Nonobstructive CAD based on calcium  score -on rosuvastatin  40 mg daily and lovaza  2 gm BID -calcium  score 133  Lab patterns: 07/27/22:TG 737: not fasting, had just returned from a friend's birthday party with lots of alcohol and [place] food, was off of atorvastatin  at the time 08/01/22: TG 568, was fasting 12-16 hours prior. Not certain when he restarted the atorvastatin  but this was around the time of trial off statin 10/12/22: TG 188, LDL 84: Back  on statin/lovaza , cut back on alcohol, good activity level 12/27/2023: Tchol 161, TG 239, HDL 38, LDL 83, fasting, on rosuvastatin  40 mg and lovaza  2gm BID  Elevated transaminases: minor elevation, will recheck. If persistently elevated, would get liver ultrasound to rule out fatty liver. He will cut back on alcohol as well  Abnormal ECG RBBB LAFB -stable from ECG at Dr. Rochelle Chu office, first told about this years ago -no high risk features -counseled on red  flag warning signs that need immediate medical attention   Hypertension: -slightly elevated today, he will check at home and keep a log -continue olmesartan   Murmur -soft, systolic -he will check to see if father had bicuspid valve, if so, order echo  CV risk counseling and prevention -recommend heart healthy/Mediterranean diet, with whole grains, fruits, vegetable, fish, lean meats, nuts, and olive oil. Limit salt. -recommend moderate walking, 3-5 times/week for 30-50 minutes each session. Aim for at least 150 minutes.week. Goal should be pace of 3 miles/hours, or walking 1.5 miles in 30 minutes -recommend avoidance of tobacco products. Avoid excess alcohol.  Dispo: 6 mos  Signed, Sheryle Donning, MD   Sheryle Donning, MD, PhD, [facility]  [facility]    Heart & Vascular at [facility] at [facility] [address], [address] [place], [place] [address] [phone]

## 2024-01-09 NOTE — Patient Instructions (Addendum)
 Medication Instructions:  Your physician recommends that you continue on your current medications as directed. Please refer to the Current Medication list given to you today.   Lab Work: Your physician recommends that you return for lab work in 1-2 months: LFT   Follow-Up: Please follow up in 6 months with Dr. Veryl Gottron, Slater Duncan, NP or Neomi Banks, NP   Other Instructions Ask your dad if his valve was bicuspid (2 leaflets, abnormal) or tricuspid (three leaflets, normal) before his surgery.  how to check blood pressure:  -sit comfortably in a chair, feet uncrossed and flat on floor, for 5-10 minutes  -arm ideally should rest at the level of the heart. However, arm should be relaxed and not tense (for example, do not hold the arm up unsupported)  -avoid exercise, caffeine, and tobacco for at least 30 minutes prior to BP reading  -don't take BP cuff reading over clothes (always place on skin directly)  -I prefer to know how well the medication is working, so I would like you to take your readings 1-2 hours after taking your blood pressure medication if possible   Goal is <130/80. If it is persistently over that, please let us  know.

## 2024-01-10 ENCOUNTER — Ambulatory Visit: Payer: Self-pay

## 2024-01-10 NOTE — Telephone Encounter (Signed)
 FYI Only or Action Required?: FYI only for provider  Patient was last seen in primary care on 10/11/2023 by Lanetta Pion, NP. Called Nurse Triage reporting [place]. Symptoms began several days ago. Interventions attempted: Nothing. Symptoms are: unchanged.  Triage Disposition: See Physician Within 24 Hours  Patient/caregiver understands and will follow disposition?: Yes, but will wait  Copied from CRM [id number]. Topic: Clinical - Red Word Triage >> Jan 10, 2024  3:28 PM [place] wrote: Red Word that prompted transfer to Nurse Triage: Red swollen bump on back- noticed 3-4 days ago. Hurts to touch. Warm to touch Reason for Disposition  [1] Swelling is painful to touch AND [2] no fever  Answer Assessment - Initial Assessment Questions 1. APPEARANCE of SWELLING: What does it look like?     Swollen, hard 2. SIZE: How large is the swelling? (e.g., inches, cm; or compare to size of pinhead, tip of pen, eraser, coin, pea, grape, ping pong ball)      Size of a coin-Quarter 3. LOCATION: Where is the swelling located?     back 4. ONSET: When did the swelling start?     3-4 days ago 5. COLOR: What color is it? Is there more than one color?     Red 6. PAIN: Is there any pain? If Yes, ask: How bad is the pain? (e.g., scale 1-10; or mild, moderate, severe)     - NONE (0): no pain   - MILD (1-3): doesn't interfere with normal activities    - MODERATE (4-7): interferes with normal activities or awakens from sleep    - SEVERE (8-10): excruciating pain, unable to do any normal activities     3/10, sometimes will throb 7. ITCH: Does it itch? If Yes, ask: How bad is the itch?      denies 8. CAUSE: What do you think caused the swelling? unsure 9 OTHER SYMPTOMS: Do you have any other symptoms? (e.g., fever)     Fatigue. Warm to touch. Hard lump.   Additional info: No acute appointments available in office this week, patient declines urgent care and requesting next available  appointment at practice. Patient will go to urgent care if symptoms worsen.  Protocols used: Skin Lump or Localized Swelling-A-AH

## 2024-01-13 DIAGNOSIS — L02212 Cutaneous abscess of back [any part, except buttock]: Secondary | ICD-10-CM | POA: Diagnosis not present

## 2024-01-14 ENCOUNTER — Ambulatory Visit: Admitting: Family Medicine

## 2024-01-14 ENCOUNTER — Encounter: Payer: Self-pay | Admitting: Internal Medicine

## 2024-01-14 ENCOUNTER — Other Ambulatory Visit: Payer: Self-pay | Admitting: Internal Medicine

## 2024-01-14 DIAGNOSIS — M1A09X0 Idiopathic chronic gout, multiple sites, without tophus (tophi): Secondary | ICD-10-CM

## 2024-01-15 ENCOUNTER — Encounter: Payer: Self-pay | Admitting: Internal Medicine

## 2024-01-15 ENCOUNTER — Ambulatory Visit (INDEPENDENT_AMBULATORY_CARE_PROVIDER_SITE_OTHER): Admitting: Internal Medicine

## 2024-01-15 VITALS — BP 130/78 | HR 66 | Temp 98.6°F | Ht 70.0 in | Wt 194.0 lb

## 2024-01-15 DIAGNOSIS — L089 Local infection of the skin and subcutaneous tissue, unspecified: Secondary | ICD-10-CM | POA: Insufficient documentation

## 2024-01-15 DIAGNOSIS — L729 Follicular cyst of the skin and subcutaneous tissue, unspecified: Secondary | ICD-10-CM | POA: Diagnosis not present

## 2024-01-15 NOTE — Telephone Encounter (Signed)
 Patient has since been seen by Dr.Burns

## 2024-01-15 NOTE — Patient Instructions (Signed)
      Monitor the area and if there are any concerns let us  know.    Medications changes include :   None

## 2024-01-15 NOTE — Progress Notes (Signed)
 Subjective:    Patient ID: Michael Miller, male    DOB: 1965-10-21, 58 y.o.   MRN: [id number]      HPI Michael Miller is here for  Chief Complaint  Patient presents with   Cyst    Cyst located on right back side of shoulder; went to ER and had it lanced. Was told to still keep f/u appointment for today for recheck.     Skin lump-he went to urgent care 6/15 and was diagnosed with abscess right upper back.  He had noticed it 5 days prior.  It was red, swollen and tender.  No drainage.  No fever.  Incision and drainage performed.  He denies any pain now.  He has been keeping a Band-Aid on it and there has been a little bit of blood on the Band-Aid.  He denies any fever.   Medications and allergies reviewed with patient and updated if appropriate.  Current Outpatient Medications on File Prior to Visit  Medication Sig Dispense Refill   allopurinol  (ZYLOPRIM ) 300 MG tablet Take 1 tablet (300 mg total) by mouth daily. Needs appointment for further refills. 90 tablet 0   busPIRone  (BUSPAR ) 10 MG tablet TAKE 1 TABLET TWICE A DAY 180 tablet 3   clonazePAM  (KLONOPIN ) 0.5 MG tablet Take 1 tablet (0.5 mg total) by mouth daily. 30 tablet 1   finasteride  (PROPECIA ) 1 MG tablet Take 1 tablet (1 mg total) by mouth daily. 90 tablet 1   MAGNESIUM PO Take by mouth 2 (two) times daily.     olmesartan  (BENICAR ) 20 MG tablet TAKE 1 TABLET DAILY 90 tablet 3   omega-3 acid ethyl esters (LOVAZA ) 1 g capsule TAKE 2 CAPSULES TWICE A DAY 360 capsule 0   rosuvastatin  (CRESTOR ) 40 MG tablet Take 1 tablet (40 mg total) by mouth daily. 90 tablet 3   sertraline  (ZOLOFT ) 100 MG tablet TAKE 1 TABLET DAILY 90 tablet 3   tadalafil  (CIALIS ) 10 MG tablet Take 1 tablet (10 mg total) by mouth every other day as needed for erectile dysfunction. 10 tablet 2   Testosterone  Enanthate (XYOSTED ) 50 MG/0.5ML SOAJ Inject 50 mg into the skin once a week. 6 mL 0   No current facility-administered medications on file prior to visit.     Review of Systems     Objective:   Vitals:   01/15/24 0750  BP: 130/78  Pulse: 66  Temp: 98.6 F (37 C)  SpO2: 99%   BP Readings from Last 3 Encounters:  01/15/24 130/78  01/09/24 136/68  07/03/23 (!) 136/94   Wt Readings from Last 3 Encounters:  01/15/24 194 lb (88 kg)  01/09/24 191 lb (86.6 kg)  07/03/23 197 lb 6.4 oz (89.5 kg)   Body mass index is 27.84 kg/m.    Physical Exam Constitutional:      General: He is not in acute distress.    Appearance: Normal appearance. He is not ill-appearing.  HENT:     Head: Normocephalic and atraumatic.   Skin:    General: Skin is warm and dry.     Findings: Lesion (Left upper back palpable indurated cyst with incision mark with no active bleeding or discharge.  With light palpation and minimal amount of pus was able to be extracted.  Area is not erythematous, nontender to touch and no warmth) present.     Comments: Smaller cyst mid back-without erythema, nontender   Neurological:     Mental Status: He is alert.  Assessment & Plan:    Infected skin cyst Acute Noticed it last week She did go to urgent care few days ago and had an incision and drainage-not prescribed any antibiotics, but at this point I do not think he needs any I do think this will become infected again He does have an appointment to see his dermatologist to discuss removal

## 2024-01-16 DIAGNOSIS — H40013 Open angle with borderline findings, low risk, bilateral: Secondary | ICD-10-CM | POA: Diagnosis not present

## 2024-01-16 DIAGNOSIS — H2513 Age-related nuclear cataract, bilateral: Secondary | ICD-10-CM | POA: Diagnosis not present

## 2024-01-18 ENCOUNTER — Encounter: Payer: Self-pay | Admitting: Internal Medicine

## 2024-01-18 ENCOUNTER — Encounter (HOSPITAL_BASED_OUTPATIENT_CLINIC_OR_DEPARTMENT_OTHER): Payer: Self-pay

## 2024-01-23 ENCOUNTER — Other Ambulatory Visit: Payer: Self-pay | Admitting: Internal Medicine

## 2024-01-30 ENCOUNTER — Other Ambulatory Visit: Payer: Self-pay | Admitting: Internal Medicine

## 2024-01-30 DIAGNOSIS — L814 Other melanin hyperpigmentation: Secondary | ICD-10-CM | POA: Diagnosis not present

## 2024-01-30 DIAGNOSIS — K580 Irritable bowel syndrome with diarrhea: Secondary | ICD-10-CM

## 2024-01-30 DIAGNOSIS — L821 Other seborrheic keratosis: Secondary | ICD-10-CM | POA: Diagnosis not present

## 2024-01-30 DIAGNOSIS — D225 Melanocytic nevi of trunk: Secondary | ICD-10-CM | POA: Diagnosis not present

## 2024-02-06 ENCOUNTER — Other Ambulatory Visit: Payer: Self-pay | Admitting: Family

## 2024-02-06 ENCOUNTER — Other Ambulatory Visit: Payer: Self-pay | Admitting: Internal Medicine

## 2024-02-06 DIAGNOSIS — F411 Generalized anxiety disorder: Secondary | ICD-10-CM

## 2024-02-06 DIAGNOSIS — E291 Testicular hypofunction: Secondary | ICD-10-CM

## 2024-02-07 ENCOUNTER — Other Ambulatory Visit (HOSPITAL_BASED_OUTPATIENT_CLINIC_OR_DEPARTMENT_OTHER): Payer: Self-pay | Admitting: Cardiology

## 2024-02-07 ENCOUNTER — Other Ambulatory Visit: Payer: Self-pay | Admitting: Internal Medicine

## 2024-02-07 DIAGNOSIS — E782 Mixed hyperlipidemia: Secondary | ICD-10-CM

## 2024-02-07 DIAGNOSIS — F411 Generalized anxiety disorder: Secondary | ICD-10-CM

## 2024-02-11 ENCOUNTER — Encounter: Payer: Self-pay | Admitting: Internal Medicine

## 2024-02-14 ENCOUNTER — Other Ambulatory Visit: Payer: Self-pay | Admitting: Internal Medicine

## 2024-02-14 DIAGNOSIS — F411 Generalized anxiety disorder: Secondary | ICD-10-CM

## 2024-02-14 MED ORDER — CLONAZEPAM 0.5 MG PO TABS: 0.5000 mg | ORAL_TABLET | Freq: Every day | ORAL | 0 refills | Status: DC

## 2024-02-14 NOTE — Telephone Encounter (Signed)
 Copied from CRM [id number]. Topic: Clinical - Medication Refill >> Feb 14, 2024 11:49 AM Rosina BIRCH wrote: Medication: clonazePAM  (KLONOPIN ) 0.5 MG tablet (patient stated the pharmacy has been trying to reach out to the provider but there has been no response)  Has the patient contacted their pharmacy? Yes (Agent: If no, request that the patient contact the pharmacy for the refill. If patient does not wish to contact the pharmacy document the reason why and proceed with request.) (Agent: If yes, when and what did the pharmacy advise?)  This is the patient's preferred pharmacy:  [facility] - [place], [place] - [address] [place] [address] Phone: [phone] Fax: [phone]  Is this the correct pharmacy for this prescription? Yes If no, delete pharmacy and type the correct one.   Has the prescription been filled recently? Yes  Is the patient out of the medication? No  Has the patient been seen for an appointment in the last year OR does the patient have an upcoming appointment? Yes  Can we respond through MyChart? Yes  Agent: Please be advised that Rx refills may take up to 3 business days. We ask that you follow-up with your pharmacy.

## 2024-02-19 ENCOUNTER — Encounter (HOSPITAL_BASED_OUTPATIENT_CLINIC_OR_DEPARTMENT_OTHER): Payer: Self-pay

## 2024-02-28 ENCOUNTER — Ambulatory Visit
Admission: RE | Admit: 2024-02-28 | Discharge: 2024-02-28 | Disposition: A | Discharge status: Home / Self Care | Source: Ambulatory Visit | Attending: Sports Medicine | Admitting: Sports Medicine

## 2024-02-28 ENCOUNTER — Other Ambulatory Visit: Payer: Self-pay | Admitting: Sports Medicine

## 2024-02-28 DIAGNOSIS — M170 Bilateral primary osteoarthritis of knee: Secondary | ICD-10-CM

## 2024-02-28 DIAGNOSIS — M25562 Pain in left knee: Secondary | ICD-10-CM | POA: Diagnosis not present

## 2024-02-28 DIAGNOSIS — M1711 Unilateral primary osteoarthritis, right knee: Secondary | ICD-10-CM | POA: Diagnosis not present

## 2024-02-28 DIAGNOSIS — M25561 Pain in right knee: Secondary | ICD-10-CM | POA: Diagnosis not present

## 2024-02-28 DIAGNOSIS — R29898 Other symptoms and signs involving the musculoskeletal system: Secondary | ICD-10-CM | POA: Diagnosis not present

## 2024-02-29 ENCOUNTER — Other Ambulatory Visit: Payer: Self-pay | Admitting: Internal Medicine

## 2024-02-29 DIAGNOSIS — L659 Nonscarring hair loss, unspecified: Secondary | ICD-10-CM

## 2024-02-29 DIAGNOSIS — E291 Testicular hypofunction: Secondary | ICD-10-CM

## 2024-03-03 ENCOUNTER — Other Ambulatory Visit: Payer: Self-pay | Admitting: Internal Medicine

## 2024-03-03 DIAGNOSIS — F411 Generalized anxiety disorder: Secondary | ICD-10-CM

## 2024-03-03 NOTE — Telephone Encounter (Signed)
 Last OV 01/15/24 Next OV 03/04/24  Last refill 08/30/23 Qty #90/1

## 2024-03-04 ENCOUNTER — Ambulatory Visit: Admitting: Internal Medicine

## 2024-03-04 VITALS — BP 128/84 | HR 64 | Temp 98.7°F | Resp 16 | Ht 69.0 in | Wt 197.4 lb

## 2024-03-04 DIAGNOSIS — E039 Hypothyroidism, unspecified: Secondary | ICD-10-CM | POA: Diagnosis not present

## 2024-03-04 DIAGNOSIS — Z0001 Encounter for general adult medical examination with abnormal findings: Secondary | ICD-10-CM

## 2024-03-04 DIAGNOSIS — M1A09X0 Idiopathic chronic gout, multiple sites, without tophus (tophi): Secondary | ICD-10-CM

## 2024-03-04 DIAGNOSIS — Z125 Encounter for screening for malignant neoplasm of prostate: Secondary | ICD-10-CM

## 2024-03-04 DIAGNOSIS — I10 Essential (primary) hypertension: Secondary | ICD-10-CM

## 2024-03-04 DIAGNOSIS — E291 Testicular hypofunction: Secondary | ICD-10-CM

## 2024-03-04 DIAGNOSIS — Z0000 Encounter for general adult medical examination without abnormal findings: Secondary | ICD-10-CM

## 2024-03-04 DIAGNOSIS — E781 Pure hyperglyceridemia: Secondary | ICD-10-CM

## 2024-03-04 DIAGNOSIS — K439 Ventral hernia without obstruction or gangrene: Secondary | ICD-10-CM

## 2024-03-04 DIAGNOSIS — Z23 Encounter for immunization: Secondary | ICD-10-CM | POA: Diagnosis not present

## 2024-03-04 DIAGNOSIS — F1010 Alcohol abuse, uncomplicated: Secondary | ICD-10-CM

## 2024-03-04 MED ORDER — NALTREXONE HCL 50 MG PO TABS: 50.0000 mg | ORAL_TABLET | Freq: Every day | ORAL | 1 refills | Status: DC

## 2024-03-04 NOTE — Telephone Encounter (Signed)
 Last OV 03/04/24 Next OV not scheduled  Last refill 02/12/24 Qty #2 mL / 0  Controlled substance, forwarding to Dr. Joshua.

## 2024-03-04 NOTE — Progress Notes (Signed)
 Subjective:  Patient ID: Michael Miller, male    DOB: March 31, 1966  Age: 58 y.o. MRN: [id number]  CC: Annual Exam, Hypertension, Hypothyroidism, Osteoarthritis, and Hyperlipidemia   HPI Michael Miller presents for a CPX and f/up ----  Discussed the use of AI scribe software for clinical note transcription with the patient, who gave verbal consent to proceed.  History of Present Illness Michael Miller is a 58 year old male who presents for a head-to-toe checkup and evaluation of weight management and ventral hernia concerns.  He maintains an active lifestyle, exercising six days a week for over an hour each day. Despite this, he has experienced a weight increase from his usual 185 pounds to around 194-196 pounds. He is considering dietary adjustments to address this issue.  He has a history of ventral hernia repairs, with three surgeries approximately ten years ago, the last involving pig mesh placement. Recently, his personal trainer observed 'coning' during sit-ups, and the patient is concerned about the possibility of a hernia recurrence. He does not experience abdominal pain, nausea, or vomiting but feels a slight difference in the area.  He has a history of elevated liver enzymes noted in May, for which he is scheduled to have additional blood work done this month. He continues to monitor his cholesterol levels.  He has a history of gout and is on allopurinol , with no recent joint pain or swelling related to gout. However, he experiences joint pain at the base of his thumbs, following surgery two to three years ago, and ongoing knee issues. He recently had knee X-rays and was started on meloxicam  last week.  He has a history of depression and anxiety, which has improved. 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 He takes klonopin  to manage anxiety and has reduced his alcohol intake but has not quit drinking.  No chest pain, shortness of breath, dizziness,  lightheadedness, abdominal pain, nausea, vomiting, blood in stool, changes in moles, symptoms of prostate cancer, or trouble urinating.     Outpatient Medications Prior to Visit  Medication Sig Dispense Refill   busPIRone  (BUSPAR ) 10 MG tablet TAKE 1 TABLET TWICE A DAY 180 tablet 3   clonazePAM  (KLONOPIN ) 0.5 MG tablet Take 1 tablet (0.5 mg total) by mouth daily. 30 tablet 0   finasteride  (PROPECIA ) 1 MG tablet TAKE 1 TABLET DAILY 90 tablet 3   MAGNESIUM PO Take by mouth 2 (two) times daily.     Meloxicam  10 MG CAPS 15 mg.     olmesartan  (BENICAR ) 20 MG tablet TAKE 1 TABLET DAILY 90 tablet 3   omega-3 acid ethyl esters (LOVAZA ) 1 g capsule TAKE 2 CAPSULES TWICE A DAY 360 capsule 0   rosuvastatin  (CRESTOR ) 40 MG tablet Take 1 tablet (40 mg total) by mouth daily. 90 tablet 3   sertraline  (ZOLOFT ) 100 MG tablet TAKE 1 TABLET DAILY 90 tablet 3   tadalafil  (CIALIS ) 10 MG tablet Take 1 tablet (10 mg total) by mouth every other day as needed for erectile dysfunction. 10 tablet 2   allopurinol  (ZYLOPRIM ) 300 MG tablet Take 1 tablet (300 mg total) by mouth daily. Needs appointment for further refills. 90 tablet 0   Testosterone  Enanthate (XYOSTED ) 50 MG/0.5ML SOAJ INJECT 50MG  SUBCUTANEOUSLY IN THE ABDOMEN ONCE WEEKLY AS DIRECTED, ROTATE SITES 2 mL 0   No facility-administered medications prior to visit.    ROS Review of Systems  Constitutional:  Positive for unexpected weight change (wt gain). Negative for appetite  change, chills, diaphoresis and fatigue.  HENT: Negative.  Negative for sore throat and trouble swallowing.   Eyes: Negative.   Respiratory:  Negative for cough, chest tightness, shortness of breath and wheezing.   Cardiovascular:  Negative for chest pain, palpitations and leg swelling.  Gastrointestinal: Negative.  Negative for abdominal pain, blood in stool, constipation, diarrhea, nausea and vomiting.  Endocrine: Negative for cold intolerance and heat intolerance.  Genitourinary:  Negative.  Negative for difficulty urinating, dysuria, penile swelling, scrotal swelling and testicular pain.  Musculoskeletal:  Positive for arthralgias. Negative for myalgias.  Skin: Negative.   Neurological: Negative.  Negative for dizziness and weakness.  Hematological:  Negative for adenopathy. Does not bruise/bleed easily.  Psychiatric/Behavioral:  Negative for decreased concentration, dysphoric mood, sleep disturbance and suicidal ideas. The patient is nervous/anxious.     Objective:  BP 128/84 (BP Location: Left Arm, Patient Position: Sitting, Cuff Size: Normal)   Pulse 64   Temp 98.7 F (37.1 C) (Oral)   Resp 16   Ht 5' 9 (1.753 m)   Wt 197 lb 6.4 oz (89.5 kg)   SpO2 97%   BMI 29.15 kg/m   BP Readings from Last 3 Encounters:  03/04/24 128/84  01/15/24 130/78  01/09/24 136/68    Wt Readings from Last 3 Encounters:  03/04/24 197 lb 6.4 oz (89.5 kg)  01/15/24 194 lb (88 kg)  01/09/24 191 lb (86.6 kg)    Physical Exam Vitals reviewed.  Constitutional:      Appearance: Normal appearance.  HENT:     Nose: Nose normal.     Mouth/Throat:     Mouth: Mucous membranes are moist.  Eyes:     General: No scleral icterus.    Conjunctiva/sclera: Conjunctivae normal.  Cardiovascular:     Rate and Rhythm: Normal rate and regular rhythm.     Heart sounds: No murmur heard.    No friction rub. No gallop.  Pulmonary:     Effort: Pulmonary effort is normal.     Breath sounds: No stridor. No wheezing, rhonchi or rales.  Abdominal:     General: Abdomen is flat. Bowel sounds are normal.     Palpations: Abdomen is soft. There is no hepatomegaly, splenomegaly or mass.     Tenderness: There is no abdominal tenderness.     Hernia: A hernia is present. Hernia is present in the ventral area. There is no hernia in the umbilical area, left inguinal area, right femoral area, left femoral area or right inguinal area.  Genitourinary:    Pubic Area: No rash.      Penis: Normal and  circumcised.      Testes: Normal.     Epididymis:     Right: Normal.     Left: Normal.     Prostate: Normal. Not enlarged, not tender and no nodules present.     Rectum: Normal. Guaiac result negative. No mass, tenderness, anal fissure, external hemorrhoid or internal hemorrhoid. Normal anal tone.  Musculoskeletal:        General: Normal range of motion.     Cervical back: Neck supple.     Right lower leg: No edema.     Left lower leg: No edema.  Lymphadenopathy:     Cervical: No cervical adenopathy.     Lower Body: No right inguinal adenopathy. No left inguinal adenopathy.  Skin:    General: Skin is warm and dry.     Findings: No rash.  Neurological:     General: No focal  deficit present.     Mental Status: He is alert. Mental status is at baseline.  Psychiatric:        Attention and Perception: Attention normal.        Mood and Affect: Mood is anxious.        Speech: Speech normal.        Behavior: Behavior normal.        Thought Content: Thought content normal.        Cognition and Memory: Cognition normal.     Lab Results  Component Value Date   WBC 6.3 03/05/2024   HGB 16.6 03/05/2024   HCT 49.5 03/05/2024   PLT 238.0 03/05/2024   GLUCOSE 83 03/05/2024   CHOL 161 12/27/2023   TRIG 239 (H) 12/27/2023   HDL 38 (L) 12/27/2023   LDLDIRECT 100.0 07/27/2022   LDLCALC 83 12/27/2023   ALT 37 03/05/2024   AST 34 03/05/2024   NA 139 03/05/2024   K 4.3 03/05/2024   CL 101 03/05/2024   CREATININE 0.90 03/05/2024   BUN 19 03/05/2024   CO2 26 03/05/2024   TSH 2.13 03/05/2024   PSA 0.48 03/05/2024   HGBA1C 5.3 10/30/2019    No results found.  Assessment & Plan:  Acquired hypothyroidism- He is euthyroid. -     TSH; Future -     Hepatic function panel; Future  Hypogonadism male -     CBC with Differential/Platelet; Future -     Testosterone  Total,Free,Bio, Males; Future -     Xyosted ; Inject 50 mg into the skin once a week.  Dispense: 6 mL; Refill:  1  Idiopathic chronic gout of multiple sites without tophus -     Uric acid; Future -     Allopurinol ; Take 1 tablet (300 mg total) by mouth daily. Needs appointment for further refills.  Dispense: 90 tablet; Refill: 1  Primary hypertension- BP is well controlled. -     CBC with Differential/Platelet; Future -     Basic metabolic panel with GFR; Future -     Hepatic function panel; Future  Hypertriglyceridemia -     Amb ref to Medical Nutrition Therapy-MNT  Alcohol use disorder, mild, abuse -     Naltrexone  HCl; Take 1 tablet (50 mg total) by mouth daily.  Dispense: 90 tablet; Refill: 1  Encounter for general adult medical examination with abnormal findings- Exam completed, labs reviewed, vaccines reviewed and updated, cancer screenings addressed, pt ed material was given.  -     PSA; Future  Ventral hernia without obstruction or gangrene -     Ambulatory referral to General Surgery  Immunization due -     Pneumococcal conjugate vaccine 20-valent     Follow-up: Return in about 6 months (around 09/04/2024).  Debby Molt, MD

## 2024-03-04 NOTE — Patient Instructions (Signed)
 Health Maintenance, Male  Adopting a healthy lifestyle and getting preventive care are important in promoting health and wellness. Ask your health care provider about:  The right schedule for you to have regular tests and exams.  Things you can do on your own to prevent diseases and keep yourself healthy.  What should I know about diet, weight, and exercise?  Eat a healthy diet    Eat a diet that includes plenty of vegetables, fruits, low-fat dairy products, and lean protein.  Do not eat a lot of foods that are high in solid fats, added sugars, or sodium.  Maintain a healthy weight  Body mass index (BMI) is a measurement that can be used to identify possible weight problems. It estimates body fat based on height and weight. Your health care provider can help determine your BMI and help you achieve or maintain a healthy weight.  Get regular exercise  Get regular exercise. This is one of the most important things you can do for your health. Most adults should:  Exercise for at least 150 minutes each week. The exercise should increase your heart rate and make you sweat (moderate-intensity exercise).  Do strengthening exercises at least twice a week. This is in addition to the moderate-intensity exercise.  Spend less time sitting. Even light physical activity can be beneficial.  Watch cholesterol and blood lipids  Have your blood tested for lipids and cholesterol at 59 years of age, then have this test every 5 years.  You may need to have your cholesterol levels checked more often if:  Your lipid or cholesterol levels are high.  You are older than 59 years of age.  You are at high risk for heart disease.  What should I know about cancer screening?  Many types of cancers can be detected early and may often be prevented. Depending on your health history and family history, you may need to have cancer screening at various ages. This may include screening for:  Colorectal cancer.  Prostate cancer.  Skin cancer.  Lung  cancer.  What should I know about heart disease, diabetes, and high blood pressure?  Blood pressure and heart disease  High blood pressure causes heart disease and increases the risk of stroke. This is more likely to develop in people who have high blood pressure readings or are overweight.  Talk with your health care provider about your target blood pressure readings.  Have your blood pressure checked:  Every 3-5 years if you are 9-95 years of age.  Every year if you are 85 years old or older.  If you are between the ages of 29 and 29 and are a current or former smoker, ask your health care provider if you should have a one-time screening for abdominal aortic aneurysm (AAA).  Diabetes  Have regular diabetes screenings. This checks your fasting blood sugar level. Have the screening done:  Once every three years after age 23 if you are at a normal weight and have a low risk for diabetes.  More often and at a younger age if you are overweight or have a high risk for diabetes.  What should I know about preventing infection?  Hepatitis B  If you have a higher risk for hepatitis B, you should be screened for this virus. Talk with your health care provider to find out if you are at risk for hepatitis B infection.  Hepatitis C  Blood testing is recommended for:  Everyone born from [birth year] through 1966.  Anyone  with known risk factors for hepatitis C.  Sexually transmitted infections (STIs)  You should be screened each year for STIs, including gonorrhea and chlamydia, if:  You are sexually active and are younger than 59 years of age.  You are older than 59 years of age and your health care provider tells you that you are at risk for this type of infection.  Your sexual activity has changed since you were last screened, and you are at increased risk for chlamydia or gonorrhea. Ask your health care provider if you are at risk.  Ask your health care provider about whether you are at high risk for HIV. Your health care provider  may recommend a prescription medicine to help prevent HIV infection. If you choose to take medicine to prevent HIV, you should first get tested for HIV. You should then be tested every 3 months for as long as you are taking the medicine.  Follow these instructions at home:  Alcohol use  Do not drink alcohol if your health care provider tells you not to drink.  If you drink alcohol:  Limit how much you have to 0-2 drinks a day.  Know how much alcohol is in your drink. In the [place] one drink equals one 12 oz bottle of beer (355 mL), one 5 oz glass of wine (148 mL), or one 1 oz glass of hard liquor (44 mL).  Lifestyle  Do not use any products that contain nicotine or tobacco. These products include cigarettes, chewing tobacco, and vaping devices, such as e-cigarettes. If you need help quitting, ask your health care provider.  Do not use street drugs.  Do not share needles.  Ask your health care provider for help if you need support or information about quitting drugs.  General instructions  Schedule regular health, dental, and eye exams.  Stay current with your vaccines.  Tell your health care provider if:  You often feel depressed.  You have ever been abused or do not feel safe at home.  Summary  Adopting a healthy lifestyle and getting preventive care are important in promoting health and wellness.  Follow your health care provider's instructions about healthy diet, exercising, and getting tested or screened for diseases.  Follow your health care provider's instructions on monitoring your cholesterol and blood pressure.  This information is not intended to replace advice given to you by your health care provider. Make sure you discuss any questions you have with your health care provider.  Document Revised: 12/06/2020 Document Reviewed: 12/06/2020  Elsevier Patient Education  2024 [employer].

## 2024-03-05 ENCOUNTER — Encounter: Payer: Self-pay | Admitting: Internal Medicine

## 2024-03-05 DIAGNOSIS — E291 Testicular hypofunction: Secondary | ICD-10-CM | POA: Diagnosis not present

## 2024-03-05 LAB — BASIC METABOLIC PANEL WITH GFR
BUN: 19 mg/dL (ref 6–23)
CO2: 26 meq/L (ref 19–32)
Calcium: 10 mg/dL (ref 8.4–10.5)
Chloride: 101 meq/L (ref 96–112)
Creatinine, Ser: 0.9 mg/dL (ref 0.40–1.50)
GFR: 93.68 mL/min (ref >60.00)
Glucose, Bld: 83 mg/dL (ref 70–99)
Potassium: 4.3 meq/L (ref 3.5–5.1)
Sodium: 139 meq/L (ref 135–145)

## 2024-03-05 LAB — CBC WITH DIFFERENTIAL/PLATELET
Basophils Absolute: 0 K/uL (ref 0.0–0.1)
Basophils Relative: 0.6 % (ref 0.0–3.0)
Eosinophils Absolute: 0.1 K/uL (ref 0.0–0.7)
Eosinophils Relative: 2 % (ref 0.0–5.0)
HCT: 49.5 % (ref 39.0–52.0)
Hemoglobin: 16.6 g/dL (ref 13.0–17.0)
Lymphocytes Relative: 29.1 % (ref 12.0–46.0)
Lymphs Abs: 1.8 K/uL (ref 0.7–4.0)
MCHC: 33.5 g/dL (ref 30.0–36.0)
MCV: 91.8 fl (ref 78.0–100.0)
Monocytes Absolute: 0.6 K/uL (ref 0.1–1.0)
Monocytes Relative: 9.9 % (ref 3.0–12.0)
Neutro Abs: 3.7 K/uL (ref 1.4–7.7)
Neutrophils Relative %: 58.4 % (ref 43.0–77.0)
Platelets: 238 K/uL (ref 150.0–400.0)
RBC: 5.39 Mil/uL (ref 4.22–5.81)
RDW: 13.9 % (ref 11.5–15.5)
WBC: 6.3 K/uL (ref 4.0–10.5)

## 2024-03-05 LAB — HEPATIC FUNCTION PANEL
ALT: 37 U/L (ref 0–53)
AST: 34 U/L (ref 0–37)
Albumin: 5 g/dL (ref 3.5–5.2)
Alkaline Phosphatase: 51 U/L (ref 39–117)
Bilirubin, Direct: 0.2 mg/dL (ref 0.0–0.3)
Total Bilirubin: 0.7 mg/dL (ref 0.2–1.2)
Total Protein: 7.5 g/dL (ref 6.0–8.3)

## 2024-03-05 LAB — URIC ACID: Uric Acid, Serum: 4.7 mg/dL (ref 4.0–7.8)

## 2024-03-05 LAB — TSH: TSH: 2.13 u[IU]/mL (ref 0.35–5.50)

## 2024-03-05 LAB — PSA: PSA: 0.48 ng/mL (ref 0.10–4.00)

## 2024-03-05 MED ORDER — ALLOPURINOL 300 MG PO TABS: 300.0000 mg | ORAL_TABLET | Freq: Every day | ORAL | 1 refills | Status: AC

## 2024-03-05 NOTE — Telephone Encounter (Signed)
 Last OV 03/04/24 Next OV not scheduled  Last refill 12/11/23 Qty # 90/3

## 2024-03-06 ENCOUNTER — Encounter: Payer: Self-pay | Admitting: Internal Medicine

## 2024-03-06 ENCOUNTER — Ambulatory Visit: Payer: Self-pay | Admitting: Internal Medicine

## 2024-03-06 LAB — TESTOSTERONE TOTAL,FREE,BIO, MALES
Albumin: 5 g/dL (ref 3.6–5.1)
Sex Hormone Binding: 21 nmol/L — ABNORMAL LOW (ref 22–77)
Testosterone, Bioavailable: 234.7 ng/dL (ref 110.0–575.0)
Testosterone, Free: 103.2 pg/mL (ref 46.0–224.0)
Testosterone: 554 ng/dL (ref 250–827)

## 2024-03-06 MED ORDER — XYOSTED 50 MG/0.5ML ~~LOC~~ SOAJ: 50.0000 mg | SUBCUTANEOUS | 1 refills | Status: DC

## 2024-03-07 ENCOUNTER — Other Ambulatory Visit: Payer: Self-pay | Admitting: Internal Medicine

## 2024-03-07 DIAGNOSIS — F1010 Alcohol abuse, uncomplicated: Secondary | ICD-10-CM

## 2024-03-07 MED ORDER — NALTREXONE HCL 50 MG PO TABS: 50.0000 mg | ORAL_TABLET | Freq: Every day | ORAL | 1 refills | Status: DC

## 2024-03-19 ENCOUNTER — Other Ambulatory Visit: Payer: Self-pay | Admitting: Internal Medicine

## 2024-03-19 DIAGNOSIS — F411 Generalized anxiety disorder: Secondary | ICD-10-CM

## 2024-03-19 MED ORDER — CLONAZEPAM 0.5 MG PO TABS: 0.5000 mg | ORAL_TABLET | Freq: Every day | ORAL | 2 refills | Status: DC

## 2024-03-19 NOTE — Telephone Encounter (Signed)
 Copied from CRM #[id number]. Topic: Clinical - Medication Refill >> Mar 19, 2024  9:40 AM Chiquita SQUIBB wrote: Medication: clonazePAM  (KLONOPIN ) 0.5 MG tablet [[id number]]  Has the patient contacted their pharmacy? Yes (Agent: If no, request that the patient contact the pharmacy for the refill. If patient does not wish to contact the pharmacy document the reason why and proceed with request.) (Agent: If yes, when and what did the pharmacy advise?)  This is the patient's preferred pharmacy:   [id number] - [place], [place] - [address] AT [facility] [address] [place] [address] Phone: [phone] Fax: [phone]  Is this the correct pharmacy for this prescription? Yes If no, delete pharmacy and type the correct one.   Has the prescription been filled recently? No  Is the patient out of the medication? Yes  Has the patient been seen for an appointment in the last year OR does the patient have an upcoming appointment? Yes  Can we respond through MyChart? Yes  Agent: Please be advised that Rx refills may take up to 3 business days. We ask that you follow-up with your pharmacy.

## 2024-03-20 ENCOUNTER — Other Ambulatory Visit: Payer: Self-pay | Admitting: Family

## 2024-03-20 DIAGNOSIS — E782 Mixed hyperlipidemia: Secondary | ICD-10-CM

## 2024-03-24 ENCOUNTER — Other Ambulatory Visit: Payer: Self-pay | Admitting: Surgery

## 2024-03-24 DIAGNOSIS — M9905 Segmental and somatic dysfunction of pelvic region: Secondary | ICD-10-CM | POA: Diagnosis not present

## 2024-03-24 DIAGNOSIS — M25561 Pain in right knee: Secondary | ICD-10-CM | POA: Diagnosis not present

## 2024-03-24 DIAGNOSIS — M6208 Separation of muscle (nontraumatic), other site: Secondary | ICD-10-CM | POA: Diagnosis not present

## 2024-03-24 DIAGNOSIS — M9904 Segmental and somatic dysfunction of sacral region: Secondary | ICD-10-CM | POA: Diagnosis not present

## 2024-03-24 DIAGNOSIS — M9906 Segmental and somatic dysfunction of lower extremity: Secondary | ICD-10-CM | POA: Diagnosis not present

## 2024-03-24 DIAGNOSIS — M25562 Pain in left knee: Secondary | ICD-10-CM | POA: Diagnosis not present

## 2024-03-24 DIAGNOSIS — G8929 Other chronic pain: Secondary | ICD-10-CM | POA: Diagnosis not present

## 2024-04-14 ENCOUNTER — Encounter: Payer: Self-pay | Admitting: Internal Medicine

## 2024-04-15 ENCOUNTER — Encounter: Attending: Internal Medicine | Admitting: Skilled Nursing Facility1

## 2024-04-15 ENCOUNTER — Encounter (HOSPITAL_BASED_OUTPATIENT_CLINIC_OR_DEPARTMENT_OTHER): Payer: Self-pay

## 2024-04-15 ENCOUNTER — Encounter: Payer: Self-pay | Admitting: Skilled Nursing Facility1

## 2024-04-15 VITALS — Ht 70.0 in | Wt 194.1 lb

## 2024-04-15 DIAGNOSIS — E785 Hyperlipidemia, unspecified: Secondary | ICD-10-CM | POA: Insufficient documentation

## 2024-04-15 NOTE — Progress Notes (Signed)
 Medical Nutrition Therapy  Appointment Start time:  7:30  Appointment End time:  8:35  Primary concerns today: weight management   Referral diagnosis: e78.2   NUTRITION ASSESSMENT    Clinical Medical Hx:  Medications: tirzeptide, see list  Labs: WNL Notable Signs/Symptoms: none reported   Lifestyle & Dietary Hx  Pt states he is Caring for his father with early signs of Alzheimer's so caring for him 2 times a week and taking him to all of his appts.  Pt states he is a stress eater. Pt states his husband is very supportive and helpful.  Pt states he tries to stay away from processed food.  Pt states they do factor meals 3 times a week Pt states since taking the tirzepetide it has curbed his appetite. Pt states his goal is to get to 180 pounds.    Body Composition Scale 04/15/2024  Current Body Weight 194.1  Total Body Fat % 23.1  Visceral Fat 13  Fat-Free Mass % 76.8   Total Body Water % 57.8  Muscle-Mass lbs 36.4  BMI 27.8  Body Fat Displacement          Torso  lbs 27.8         Left Leg  lbs 5.5         Right Leg  lbs 5.5         Left Arm  lbs 2.7         Right Arm   lbs 2.7    Estimated daily fluid intake: 64+ oz Supplements:  Sleep:  Stress / self-care: pts father is recently diagnosed with alzheimers Current average weekly physical activity: 45-60 5-6 days a week cardio  24-Hr Dietary Recall First Meal: iced coffee + flavored creamer and protein bar  Snack: 2 boiled eggs + gaucamole Second Meal: tuna packets + rice cracker Snack: nuts Third Meal: chicken + rice pilaf + steamed broccoli Snack: oat ice cream + blueberries Beverages: (2 servings) wine low calorie/lower sugar, vodka, water daily sometimes with lemon or oranges, coke zero daily, low sugar arnold palmer, carbonated water   NUTRITION INTERVENTION  Nutrition education (E-1) on the following topics:  Creation of balanced and diverse meals to increase the intake of nutrient-rich foods that provide  essential vitamins, minerals, fiber, and phytonutrients Variety of Fruits and Vegetables: Aim for a colorful array of fruits and vegetables to ensure a wide range of nutrients. Include a mix of leafy greens, berries, citrus fruits, cruciferous vegetables, and more. Whole Grains: Choose whole grains over refined grains. Examples include brown rice, quinoa, oats, whole wheat, and barley. Lean Proteins: Include lean sources of protein, such as poultry, fish, tofu, legumes, beans, lentils, and low-fat dairy products. Limit red and processed meats. Healthy Fats: Incorporate sources of healthy fats, including avocados, nuts, seeds, and olive oil. Limit saturated and trans fats found in fried and processed foods. Dairy or Dairy Alternatives: Choose low-fat or fat-free dairy products, or plant-based alternatives like almond or soy milk. Portion Control: Be mindful of portion sizes to avoid overeating. Pay attention to hunger and satisfaction cues. Limit Added Sugars: Minimize the consumption of sugary beverages, snacks, and desserts. Check food labels for added sugars and opt for natural sources of sweetness such as whole fruits. Hydration: Drink plenty of water throughout the day. Limit sugary drinks and excessive caffeine intake. Moderate Sodium Intake: Reduce the consumption of high-sodium foods. Use herbs and spices for flavor instead of excessive salt. Meal Planning and Preparation: Plan and prepare meals  ahead of time to make healthier choices more convenient. Include a mix of food groups in each meal. Limit Processed Foods: Minimize the intake of highly processed and packaged foods that are often high in added sugars, salt, and unhealthy fats. Regular Physical Activity: Combine a healthy diet with regular physical activity for overall well-being. Aim for at least 150 minutes of moderate-intensity aerobic exercise per week, along with strength training. Moderation and Balance: Enjoy  treats and indulgent foods in moderation, emphasizing balance rather than strict restriction.  Handouts Provided Include  Detailed MyPlate  Learning Style & Readiness for Change Teaching method utilized: Visual & Auditory  Demonstrated degree of understanding via: Teach Back  Barriers to learning/adherence to lifestyle change: emotional eating   Goals Established by Pt Do core workouts for 20-30 minutes 3-4 days a week Aim to eat non starchy vegetables 2 times a day 7 days a week Work on reframing    MONITORING & EVALUATION Dietary intake, weekly physical activity  Next Steps  Patient is to call or email with any future questions or concerns.

## 2024-04-28 ENCOUNTER — Encounter: Payer: Self-pay | Admitting: Internal Medicine

## 2024-05-01 NOTE — Telephone Encounter (Signed)
 Error

## 2024-05-05 DIAGNOSIS — M25561 Pain in right knee: Secondary | ICD-10-CM | POA: Diagnosis not present

## 2024-05-05 DIAGNOSIS — M25569 Pain in unspecified knee: Secondary | ICD-10-CM | POA: Insufficient documentation

## 2024-05-05 DIAGNOSIS — G8929 Other chronic pain: Secondary | ICD-10-CM | POA: Diagnosis not present

## 2024-05-05 DIAGNOSIS — M25562 Pain in left knee: Secondary | ICD-10-CM | POA: Diagnosis not present

## 2024-05-05 DIAGNOSIS — M9909 Segmental and somatic dysfunction of abdomen and other regions: Secondary | ICD-10-CM | POA: Insufficient documentation

## 2024-05-26 DIAGNOSIS — G8929 Other chronic pain: Secondary | ICD-10-CM | POA: Diagnosis not present

## 2024-05-26 DIAGNOSIS — M25562 Pain in left knee: Secondary | ICD-10-CM | POA: Diagnosis not present

## 2024-05-26 DIAGNOSIS — M9906 Segmental and somatic dysfunction of lower extremity: Secondary | ICD-10-CM | POA: Diagnosis not present

## 2024-05-26 DIAGNOSIS — M9902 Segmental and somatic dysfunction of thoracic region: Secondary | ICD-10-CM | POA: Diagnosis not present

## 2024-05-26 DIAGNOSIS — M9903 Segmental and somatic dysfunction of lumbar region: Secondary | ICD-10-CM | POA: Diagnosis not present

## 2024-05-26 DIAGNOSIS — M25561 Pain in right knee: Secondary | ICD-10-CM | POA: Diagnosis not present

## 2024-05-26 DIAGNOSIS — M9904 Segmental and somatic dysfunction of sacral region: Secondary | ICD-10-CM | POA: Diagnosis not present

## 2024-06-02 DIAGNOSIS — M9903 Segmental and somatic dysfunction of lumbar region: Secondary | ICD-10-CM | POA: Diagnosis not present

## 2024-06-02 DIAGNOSIS — M9905 Segmental and somatic dysfunction of pelvic region: Secondary | ICD-10-CM | POA: Diagnosis not present

## 2024-06-02 DIAGNOSIS — M9901 Segmental and somatic dysfunction of cervical region: Secondary | ICD-10-CM | POA: Diagnosis not present

## 2024-06-02 DIAGNOSIS — M9906 Segmental and somatic dysfunction of lower extremity: Secondary | ICD-10-CM | POA: Diagnosis not present

## 2024-06-05 DIAGNOSIS — M9905 Segmental and somatic dysfunction of pelvic region: Secondary | ICD-10-CM | POA: Diagnosis not present

## 2024-06-05 DIAGNOSIS — M9903 Segmental and somatic dysfunction of lumbar region: Secondary | ICD-10-CM | POA: Diagnosis not present

## 2024-06-05 DIAGNOSIS — M9906 Segmental and somatic dysfunction of lower extremity: Secondary | ICD-10-CM | POA: Diagnosis not present

## 2024-06-05 DIAGNOSIS — M9901 Segmental and somatic dysfunction of cervical region: Secondary | ICD-10-CM | POA: Diagnosis not present

## 2024-06-12 DIAGNOSIS — M9905 Segmental and somatic dysfunction of pelvic region: Secondary | ICD-10-CM | POA: Diagnosis not present

## 2024-06-12 DIAGNOSIS — M9901 Segmental and somatic dysfunction of cervical region: Secondary | ICD-10-CM | POA: Diagnosis not present

## 2024-06-12 DIAGNOSIS — M9903 Segmental and somatic dysfunction of lumbar region: Secondary | ICD-10-CM | POA: Diagnosis not present

## 2024-06-12 DIAGNOSIS — M9906 Segmental and somatic dysfunction of lower extremity: Secondary | ICD-10-CM | POA: Diagnosis not present

## 2024-06-13 ENCOUNTER — Encounter (HOSPITAL_BASED_OUTPATIENT_CLINIC_OR_DEPARTMENT_OTHER): Payer: Self-pay

## 2024-06-18 DIAGNOSIS — M9905 Segmental and somatic dysfunction of pelvic region: Secondary | ICD-10-CM | POA: Diagnosis not present

## 2024-06-18 DIAGNOSIS — M9901 Segmental and somatic dysfunction of cervical region: Secondary | ICD-10-CM | POA: Diagnosis not present

## 2024-06-18 DIAGNOSIS — M9903 Segmental and somatic dysfunction of lumbar region: Secondary | ICD-10-CM | POA: Diagnosis not present

## 2024-06-18 DIAGNOSIS — M9906 Segmental and somatic dysfunction of lower extremity: Secondary | ICD-10-CM | POA: Diagnosis not present

## 2024-06-23 DIAGNOSIS — M542 Cervicalgia: Secondary | ICD-10-CM | POA: Insufficient documentation

## 2024-06-23 DIAGNOSIS — M545 Low back pain, unspecified: Secondary | ICD-10-CM | POA: Insufficient documentation

## 2024-06-25 DIAGNOSIS — M9901 Segmental and somatic dysfunction of cervical region: Secondary | ICD-10-CM | POA: Diagnosis not present

## 2024-06-25 DIAGNOSIS — M9905 Segmental and somatic dysfunction of pelvic region: Secondary | ICD-10-CM | POA: Diagnosis not present

## 2024-06-25 DIAGNOSIS — M9906 Segmental and somatic dysfunction of lower extremity: Secondary | ICD-10-CM | POA: Diagnosis not present

## 2024-06-25 DIAGNOSIS — M9903 Segmental and somatic dysfunction of lumbar region: Secondary | ICD-10-CM | POA: Diagnosis not present

## 2024-07-07 DIAGNOSIS — M9906 Segmental and somatic dysfunction of lower extremity: Secondary | ICD-10-CM | POA: Diagnosis not present

## 2024-07-07 DIAGNOSIS — M9903 Segmental and somatic dysfunction of lumbar region: Secondary | ICD-10-CM | POA: Diagnosis not present

## 2024-07-07 DIAGNOSIS — M9905 Segmental and somatic dysfunction of pelvic region: Secondary | ICD-10-CM | POA: Diagnosis not present

## 2024-07-07 DIAGNOSIS — M9901 Segmental and somatic dysfunction of cervical region: Secondary | ICD-10-CM | POA: Diagnosis not present

## 2024-07-11 ENCOUNTER — Other Ambulatory Visit: Payer: Self-pay | Admitting: Internal Medicine

## 2024-07-11 ENCOUNTER — Other Ambulatory Visit: Payer: Self-pay | Admitting: Family

## 2024-07-11 DIAGNOSIS — F411 Generalized anxiety disorder: Secondary | ICD-10-CM

## 2024-07-14 ENCOUNTER — Encounter: Payer: Self-pay | Admitting: Internal Medicine

## 2024-07-16 DIAGNOSIS — H40013 Open angle with borderline findings, low risk, bilateral: Secondary | ICD-10-CM | POA: Diagnosis not present

## 2024-07-16 DIAGNOSIS — H2513 Age-related nuclear cataract, bilateral: Secondary | ICD-10-CM | POA: Diagnosis not present

## 2024-07-17 DIAGNOSIS — M9901 Segmental and somatic dysfunction of cervical region: Secondary | ICD-10-CM | POA: Diagnosis not present

## 2024-07-17 DIAGNOSIS — M9903 Segmental and somatic dysfunction of lumbar region: Secondary | ICD-10-CM | POA: Diagnosis not present

## 2024-07-17 DIAGNOSIS — M9906 Segmental and somatic dysfunction of lower extremity: Secondary | ICD-10-CM | POA: Diagnosis not present

## 2024-07-17 DIAGNOSIS — M9905 Segmental and somatic dysfunction of pelvic region: Secondary | ICD-10-CM | POA: Diagnosis not present

## 2024-07-28 DIAGNOSIS — M9903 Segmental and somatic dysfunction of lumbar region: Secondary | ICD-10-CM | POA: Diagnosis not present

## 2024-07-28 DIAGNOSIS — M9901 Segmental and somatic dysfunction of cervical region: Secondary | ICD-10-CM | POA: Diagnosis not present

## 2024-07-28 DIAGNOSIS — M9906 Segmental and somatic dysfunction of lower extremity: Secondary | ICD-10-CM | POA: Diagnosis not present

## 2024-07-28 DIAGNOSIS — M9905 Segmental and somatic dysfunction of pelvic region: Secondary | ICD-10-CM | POA: Diagnosis not present

## 2024-08-04 ENCOUNTER — Encounter: Admitting: Internal Medicine

## 2024-08-05 ENCOUNTER — Ambulatory Visit (HOSPITAL_BASED_OUTPATIENT_CLINIC_OR_DEPARTMENT_OTHER): Admitting: Cardiology

## 2024-08-11 ENCOUNTER — Encounter: Payer: Self-pay | Admitting: Internal Medicine

## 2024-08-14 ENCOUNTER — Ambulatory Visit: Admitting: Internal Medicine

## 2024-08-14 ENCOUNTER — Encounter: Payer: Self-pay | Admitting: Internal Medicine

## 2024-08-14 VITALS — BP 120/86 | HR 61 | Temp 97.7°F | Resp 16 | Ht 70.0 in | Wt 188.8 lb

## 2024-08-14 DIAGNOSIS — I10 Essential (primary) hypertension: Secondary | ICD-10-CM | POA: Diagnosis not present

## 2024-08-14 DIAGNOSIS — Z23 Encounter for immunization: Secondary | ICD-10-CM

## 2024-08-14 DIAGNOSIS — E785 Hyperlipidemia, unspecified: Secondary | ICD-10-CM

## 2024-08-14 DIAGNOSIS — E291 Testicular hypofunction: Secondary | ICD-10-CM

## 2024-08-14 DIAGNOSIS — E781 Pure hyperglyceridemia: Secondary | ICD-10-CM

## 2024-08-14 DIAGNOSIS — E039 Hypothyroidism, unspecified: Secondary | ICD-10-CM

## 2024-08-14 DIAGNOSIS — M5430 Sciatica, unspecified side: Secondary | ICD-10-CM | POA: Insufficient documentation

## 2024-08-14 LAB — CBC WITH DIFFERENTIAL/PLATELET
Basophils Absolute: 0 K/uL (ref 0.0–0.1)
Basophils Relative: 0.7 % (ref 0.0–3.0)
Eosinophils Absolute: 0.1 K/uL (ref 0.0–0.7)
Eosinophils Relative: 1.9 % (ref 0.0–5.0)
HCT: 49.2 % (ref 39.0–52.0)
Hemoglobin: 16.9 g/dL (ref 13.0–17.0)
Lymphocytes Relative: 30.4 % (ref 12.0–46.0)
Lymphs Abs: 2.1 K/uL (ref 0.7–4.0)
MCHC: 34.3 g/dL (ref 30.0–36.0)
MCV: 90.2 fl (ref 78.0–100.0)
Monocytes Absolute: 0.7 K/uL (ref 0.1–1.0)
Monocytes Relative: 10.1 % (ref 3.0–12.0)
Neutro Abs: 4 K/uL (ref 1.4–7.7)
Neutrophils Relative %: 56.9 % (ref 43.0–77.0)
Platelets: 248 K/uL (ref 150.0–400.0)
RBC: 5.46 Mil/uL (ref 4.22–5.81)
RDW: 13.9 % (ref 11.5–15.5)
WBC: 7 K/uL (ref 4.0–10.5)

## 2024-08-14 LAB — URINALYSIS, ROUTINE W REFLEX MICROSCOPIC
Bilirubin Urine: NEGATIVE
Ketones, ur: NEGATIVE
Leukocytes,Ua: NEGATIVE
Nitrite: NEGATIVE
Specific Gravity, Urine: 1.01 (ref 1.000–1.030)
Total Protein, Urine: NEGATIVE
Urine Glucose: NEGATIVE
Urobilinogen, UA: 0.2 (ref 0.0–1.0)
WBC, UA: NONE SEEN
pH: 6.5 (ref 5.0–8.0)

## 2024-08-14 LAB — LIPID PANEL
Cholesterol: 127 mg/dL (ref 28–200)
HDL: 41.7 mg/dL
LDL Cholesterol: 56 mg/dL (ref 10–99)
NonHDL: 85.57
Total CHOL/HDL Ratio: 3
Triglycerides: 150 mg/dL — ABNORMAL HIGH (ref 10.0–149.0)
VLDL: 30 mg/dL (ref 0.0–40.0)

## 2024-08-14 LAB — BASIC METABOLIC PANEL WITH GFR
BUN: 16 mg/dL (ref 6–23)
CO2: 32 meq/L (ref 19–32)
Calcium: 10 mg/dL (ref 8.4–10.5)
Chloride: 102 meq/L (ref 96–112)
Creatinine, Ser: 1.02 mg/dL (ref 0.40–1.50)
GFR: 80.36 mL/min
Glucose, Bld: 97 mg/dL (ref 70–99)
Potassium: 4.4 meq/L (ref 3.5–5.1)
Sodium: 138 meq/L (ref 135–145)

## 2024-08-14 LAB — TESTOSTERONE TOTAL,FREE,BIO, MALES
Albumin: 5.1 g/dL (ref 3.6–5.1)
Sex Hormone Binding: 23 nmol/L (ref 22–77)
Testosterone, Bioavailable: 265.2 ng/dL (ref 110.0–575.0)
Testosterone, Free: 114.4 pg/mL (ref 46.0–224.0)
Testosterone: 642 ng/dL (ref 250–827)

## 2024-08-14 LAB — THYROID PANEL WITH TSH
Free Thyroxine Index: 2.1 (ref 1.4–3.8)
T3 Uptake: 32 % (ref 22–35)
T4, Total: 6.6 ug/dL (ref 4.9–10.5)
TSH: 1.97 m[IU]/L (ref 0.40–4.50)

## 2024-08-14 MED ORDER — BOOSTRIX 5-2.5-18.5 LF-MCG/0.5 IM SUSP: 0.5000 mL | Freq: Once | INTRAMUSCULAR | 0 refills | Status: AC

## 2024-08-14 NOTE — Progress Notes (Unsigned)
 "  Subjective:  Patient ID: Michael Miller, male    DOB: 05/27/1966  Age: 59 y.o. MRN: [id number]  CC: Hypertension and Hyperlipidemia   HPI Michael Miller presents for f/up --  Discussed the use of AI scribe software for clinical note transcription with the patient, who gave verbal consent to proceed.  History of Present Illness Michael Miller is a 59 year old male who presents for a six-month checkup.  He occasionally feels lightheaded upon standing since starting new medications, which he attributes to the timing of his medication and possibly reduced food intake. He denies persistent weakness, dizziness, or lightheadedness, but does report occasional lightheadedness upon standing since starting new medications.  He has been on a GLP-1 receptor agonist and Samorelin for approximately four months, administering the GLP-1 injection weekly on Wednesdays. He has lost about 10 pounds, with his weight now at 188 pounds, down from an average of 195 pounds. No nausea, vomiting, abdominal pain, or constipation. He is concerned about potential kidney effects from his medications, although he is not experiencing any major issues. He is unsure of the Samorelin dose as it is managed by his uncle.  He reports low libido and delayed erections for at least six months, despite being on testosterone  therapy.  He is up to date with flu and COVID-19 vaccinations but has not received a TDAP booster since November 2015.     Thank you.  May I get bloodwork done for the following: I would like to get a baseline since I did my first injection tonight? Thanks, [facility] ([facility])   Baseline labs before starting:   IGF-1   Fasting glucose / A1C   Thyroid  panel (TSH, free T4  T3)   Recheck IGF-1 and glucose after ~8-12 weeks   Avoid blind dosing without labs    Sermorelin works best when your pituitary is functional and GH is actually low.  This is not for my annual  physical exam. This is to speak with Dr.Jaxxson Cavanah regarding me taking GLP-1 (Tirzepatide) and Sermorelin (HGH). Depending what Dr. Joshua, I may want to have my kidney function checked now that I have been taking them both. Thanks, Michael Miller    ROS Review of Systems  Objective:  BP 120/86 (BP Location: Left Arm, Patient Position: Sitting, Cuff Size: Normal)   Pulse 61   Temp 97.7 F (36.5 C) (Oral)   Resp 16   Ht 5' 10 (1.778 m)   Wt 188 lb 12.8 oz (85.6 kg)   SpO2 96%   BMI 27.09 kg/m   BP Readings from Last 3 Encounters:  08/14/24 120/86  03/04/24 128/84  01/15/24 130/78    Wt Readings from Last 3 Encounters:  08/14/24 188 lb 12.8 oz (85.6 kg)  04/15/24 194 lb 1.6 oz (88 kg)  03/04/24 197 lb 6.4 oz (89.5 kg)    Physical Exam  Lab Results  Component Value Date   WBC 6.3 03/05/2024   HGB 16.6 03/05/2024   HCT 49.5 03/05/2024   PLT 238.0 03/05/2024   GLUCOSE 83 03/05/2024   CHOL 161 12/27/2023   TRIG 239 (H) 12/27/2023   HDL 38 (L) 12/27/2023   LDLDIRECT 100.0 07/27/2022   LDLCALC 83 12/27/2023   ALT 37 03/05/2024   AST 34 03/05/2024   NA 139 03/05/2024   K 4.3 03/05/2024   CL 101 03/05/2024   CREATININE 0.90 03/05/2024   BUN 19 03/05/2024   CO2 26 03/05/2024   TSH  2.13 03/05/2024   PSA 0.48 03/05/2024   HGBA1C 5.3 10/30/2019    DG Knee 3 Views Right Result Date: 03/05/2024 CLINICAL DATA:  BILATERAL knee pain for 1 year, worse on the RIGHT. No known injury. EXAM: RIGHT KNEE - 3 VIEW; LEFT KNEE - 3 VIEW COMPARISON:  None available FINDINGS: LEFT knee: No acute osseous abnormality. Soft tissues are normal. Joint spaces are maintained. RIGHT knee: No acute osseous abnormality. Soft tissues are normal. Joint spaces are maintained. IMPRESSION: No radiographic abnormality of the knees. Electronically Signed   By: Aliene Lloyd M.D.   On: 03/05/2024 11:44   DG Knee 3 Views Left Result Date: 03/05/2024 CLINICAL DATA:  BILATERAL knee pain for 1 year, worse on the RIGHT. No  known injury. EXAM: RIGHT KNEE - 3 VIEW; LEFT KNEE - 3 VIEW COMPARISON:  None available FINDINGS: LEFT knee: No acute osseous abnormality. Soft tissues are normal. Joint spaces are maintained. RIGHT knee: No acute osseous abnormality. Soft tissues are normal. Joint spaces are maintained. IMPRESSION: No radiographic abnormality of the knees. Electronically Signed   By: Aliene Lloyd M.D.   On: 03/05/2024 11:44    Assessment & Plan:  Acquired hypothyroidism -     Thyroid  Panel With TSH; Future  Primary hypertension -     Urinalysis, Routine w reflex microscopic; Future -     Basic metabolic panel with GFR; Future  Hypertriglyceridemia -     Lipid panel; Future  Hypogonadism male -     CBC with Differential/Platelet; Future -     Testosterone  Total,Free,Bio, Males; Future  Hyperlipidemia with target LDL less than 130 -     Lipid panel; Future -     Thyroid  Panel With TSH; Future  Need for prophylactic vaccination with combined diphtheria-tetanus-pertussis (DTP) vaccine -     Boostrix ; Inject 0.5 mLs into the muscle once for 1 dose.  Dispense: 0.5 mL; Refill: 0     Follow-up: Return in about 6 months (around 02/11/2025).  Debby Molt, MD "

## 2024-08-14 NOTE — Patient Instructions (Signed)
 Hypertension, Adult High blood pressure (hypertension) is when the force of blood pumping through the arteries is too strong. The arteries are the blood vessels that carry blood from the heart throughout the body. Hypertension forces the heart to work harder to pump blood and may cause arteries to become narrow or stiff. Untreated or uncontrolled hypertension can lead to a heart attack, heart failure, a stroke, kidney disease, and other problems. A blood pressure reading consists of a higher number over a lower number. Ideally, your blood pressure should be below 120/80. The first ("top") number is called the systolic pressure. It is a measure of the pressure in your arteries as your heart beats. The second ("bottom") number is called the diastolic pressure. It is a measure of the pressure in your arteries as the heart relaxes. What are the causes? The exact cause of this condition is not known. There are some conditions that result in high blood pressure. What increases the risk? Certain factors may make you more likely to develop high blood pressure. Some of these risk factors are under your control, including: Smoking. Not getting enough exercise or physical activity. Being overweight. Having too much fat, sugar, calories, or salt (sodium) in your diet. Drinking too much alcohol. Other risk factors include: Having a personal history of heart disease, diabetes, high cholesterol, or kidney disease. Stress. Having a family history of high blood pressure and high cholesterol. Having obstructive sleep apnea. Age. The risk increases with age. What are the signs or symptoms? High blood pressure may not cause symptoms. Very high blood pressure (hypertensive crisis) may cause: Headache. Fast or irregular heartbeats (palpitations). Shortness of breath. Nosebleed. Nausea and vomiting. Vision changes. Severe chest pain, dizziness, and seizures. How is this diagnosed? This condition is diagnosed by  measuring your blood pressure while you are seated, with your arm resting on a flat surface, your legs uncrossed, and your feet flat on the floor. The cuff of the blood pressure monitor will be placed directly against the skin of your upper arm at the level of your heart. Blood pressure should be measured at least twice using the same arm. Certain conditions can cause a difference in blood pressure between your right and left arms. If you have a high blood pressure reading during one visit or you have normal blood pressure with other risk factors, you may be asked to: Return on a different day to have your blood pressure checked again. Monitor your blood pressure at home for 1 week or longer. If you are diagnosed with hypertension, you may have other blood or imaging tests to help your health care provider understand your overall risk for other conditions. How is this treated? This condition is treated by making healthy lifestyle changes, such as eating healthy foods, exercising more, and reducing your alcohol intake. You may be referred for counseling on a healthy diet and physical activity. Your health care provider may prescribe medicine if lifestyle changes are not enough to get your blood pressure under control and if: Your systolic blood pressure is above 130. Your diastolic blood pressure is above 80. Your personal target blood pressure may vary depending on your medical conditions, your age, and other factors. Follow these instructions at home: Eating and drinking  Eat a diet that is high in fiber and potassium, and low in sodium, added sugar, and fat. An example of this eating plan is called the DASH diet. DASH stands for Dietary Approaches to Stop Hypertension. To eat this way: Eat  plenty of fresh fruits and vegetables. Try to fill one half of your plate at each meal with fruits and vegetables. Eat whole grains, such as whole-wheat pasta, brown rice, or whole-grain bread. Fill about one  fourth of your plate with whole grains. Eat or drink low-fat dairy products, such as skim milk or low-fat yogurt. Avoid fatty cuts of meat, processed or cured meats, and poultry with skin. Fill about one fourth of your plate with lean proteins, such as fish, chicken without skin, beans, eggs, or tofu. Avoid pre-made and processed foods. These tend to be higher in sodium, added sugar, and fat. Reduce your daily sodium intake. Many people with hypertension should eat less than 1,500 mg of sodium a day. Do not drink alcohol if: Your health care provider tells you not to drink. You are pregnant, may be pregnant, or are planning to become pregnant. If you drink alcohol: Limit how much you have to: 0-1 drink a day for women. 0-2 drinks a day for men. Know how much alcohol is in your drink. In the [place] one drink equals one 12 oz bottle of beer (355 mL), one 5 oz glass of wine (148 mL), or one 1 oz glass of hard liquor (44 mL). Lifestyle  Work with your health care provider to maintain a healthy body weight or to lose weight. Ask what an ideal weight is for you. Get at least 30 minutes of exercise that causes your heart to beat faster (aerobic exercise) most days of the week. Activities may include walking, swimming, or biking. Include exercise to strengthen your muscles (resistance exercise), such as Pilates or lifting weights, as part of your weekly exercise routine. Try to do these types of exercises for 30 minutes at least 3 days a week. Do not use any products that contain nicotine or tobacco. These products include cigarettes, chewing tobacco, and vaping devices, such as e-cigarettes. If you need help quitting, ask your health care provider. Monitor your blood pressure at home as told by your health care provider. Keep all follow-up visits. This is important. Medicines Take over-the-counter and prescription medicines only as told by your health care provider. Follow directions carefully. Blood  pressure medicines must be taken as prescribed. Do not skip doses of blood pressure medicine. Doing this puts you at risk for problems and can make the medicine less effective. Ask your health care provider about side effects or reactions to medicines that you should watch for. Contact a health care provider if you: Think you are having a reaction to a medicine you are taking. Have headaches that keep coming back (recurring). Feel dizzy. Have swelling in your ankles. Have trouble with your vision. Get help right away if you: Develop a severe headache or confusion. Have unusual weakness or numbness. Feel faint. Have severe pain in your chest or abdomen. Vomit repeatedly. Have trouble breathing. These symptoms may be an emergency. Get help right away. Call 911. Do not wait to see if the symptoms will go away. Do not drive yourself to the hospital. Summary Hypertension is when the force of blood pumping through your arteries is too strong. If this condition is not controlled, it may put you at risk for serious complications. Your personal target blood pressure may vary depending on your medical conditions, your age, and other factors. For most people, a normal blood pressure is less than 120/80. Hypertension is treated with lifestyle changes, medicines, or a combination of both. Lifestyle changes include losing weight, eating a healthy,  low-sodium diet, exercising more, and limiting alcohol. This information is not intended to replace advice given to you by your health care provider. Make sure you discuss any questions you have with your health care provider. Document Revised: 05/24/2021 Document Reviewed: 05/24/2021 Elsevier Patient Education  2024 [employer].

## 2024-08-15 ENCOUNTER — Ambulatory Visit: Payer: Self-pay | Admitting: Internal Medicine

## 2024-08-20 ENCOUNTER — Other Ambulatory Visit: Payer: Self-pay | Admitting: Internal Medicine

## 2024-08-20 DIAGNOSIS — F411 Generalized anxiety disorder: Secondary | ICD-10-CM

## 2024-08-20 DIAGNOSIS — E291 Testicular hypofunction: Secondary | ICD-10-CM

## 2024-08-20 MED ORDER — XYOSTED 50 MG/0.5ML ~~LOC~~ SOAJ: 50.0000 mg | SUBCUTANEOUS | 1 refills | Status: AC

## 2024-08-20 NOTE — Telephone Encounter (Signed)
Both taken care of.  

## 2024-10-09 ENCOUNTER — Ambulatory Visit (HOSPITAL_BASED_OUTPATIENT_CLINIC_OR_DEPARTMENT_OTHER): Admitting: Cardiology

## 2025-03-09 ENCOUNTER — Encounter: Admitting: Internal Medicine
